# Patient Record
Sex: Female | Born: 1945 | Race: White | Hispanic: No | Marital: Married | Smoking: Former smoker
Health system: Southern US, Community
[De-identification: ages and names within clinical notes are randomized; demographics above are authoritative.]

## PROBLEM LIST (undated history)

## (undated) DIAGNOSIS — J45909 Unspecified asthma, uncomplicated: Secondary | ICD-10-CM

## (undated) DIAGNOSIS — E785 Hyperlipidemia, unspecified: Secondary | ICD-10-CM

## (undated) HISTORY — PX: OTHER SURGICAL HISTORY: SHX169

---

## 2005-07-15 ENCOUNTER — Encounter: Payer: Self-pay | Admitting: Obstetrics and Gynecology

## 2005-07-29 ENCOUNTER — Encounter: Payer: Self-pay | Admitting: Obstetrics and Gynecology

## 2005-08-29 ENCOUNTER — Encounter: Payer: Self-pay | Admitting: Obstetrics and Gynecology

## 2010-07-20 ENCOUNTER — Ambulatory Visit: Payer: Self-pay | Admitting: Unknown Physician Specialty

## 2016-07-15 ENCOUNTER — Emergency Department
Admission: EM | Admit: 2016-07-15 | Discharge: 2016-07-15 | Disposition: A | Discharge status: Home / Self Care | Payer: Worker's Compensation | Attending: Student in an Organized Health Care Education/Training Program | Admitting: Student in an Organized Health Care Education/Training Program

## 2016-07-15 ENCOUNTER — Encounter: Payer: Self-pay | Admitting: Emergency Medicine

## 2016-07-15 DIAGNOSIS — J45909 Unspecified asthma, uncomplicated: Secondary | ICD-10-CM | POA: Insufficient documentation

## 2016-07-15 DIAGNOSIS — M7989 Other specified soft tissue disorders: Secondary | ICD-10-CM | POA: Diagnosis present

## 2016-07-15 DIAGNOSIS — Z87891 Personal history of nicotine dependence: Secondary | ICD-10-CM | POA: Insufficient documentation

## 2016-07-15 DIAGNOSIS — L03011 Cellulitis of right finger: Secondary | ICD-10-CM | POA: Insufficient documentation

## 2016-07-15 HISTORY — DX: Unspecified asthma, uncomplicated: J45.909

## 2016-07-15 MED ORDER — SULFAMETHOXAZOLE-TRIMETHOPRIM 800-160 MG PO TABS: 1.0000 | ORAL_TABLET | Freq: Two times a day (BID) | ORAL | 0 refills | Status: AC

## 2016-07-15 MED ORDER — PENTAFLUOROPROP-TETRAFLUOROETH EX AERO
INHALATION_SPRAY | CUTANEOUS | Status: AC
  Filled 2016-07-15: qty 30

## 2016-07-15 NOTE — ED Triage Notes (Signed)
Pt is worker comp

## 2016-07-15 NOTE — ED Triage Notes (Signed)
Pt reports hurt 3rd digit right hand a little over week ago at work. Swelling noted. Not sure how she hurt. repetitive motion.

## 2016-07-15 NOTE — ED Notes (Signed)
Per EHS Manager ([employer]) Claudia PaulaJeff Bryant [phone] pt doesn't require any type of drug screening.

## 2016-07-15 NOTE — ED Notes (Signed)
Pt reports finger pain started about 3 weeks ago under the nail of her middle finger.  Pt reports worsening swelling and pain since then. 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  No drainage noted at this time, but there is a pus filled blister below the nail.

## 2016-07-15 NOTE — ED Provider Notes (Signed)
[facility] Emergency Department Provider Note ____________________________________________  Time seen: Approximately 3:21 PM  I have reviewed the triage vital signs and the nursing notes.   HISTORY  Chief Complaint Finger Injury  HPI Rosine BeatDoris J Vasil is a 70 y.o. female that presents with swelling of the third digit of her right hand for 3 weeks that worsened in the last week. Patient states that pus started forming in the last week. Finger is not painful to touch. Patient can move his finger normally. Patient has never had anything like this before. Patient has been taking Advil for pain.  Past Medical History:  Diagnosis Date  . Asthma    There are no active problems to display for this patient.  History reviewed. No pertinent surgical history.  Prior to Admission medications   Not on File    Allergies Patient has no known allergies.  History reviewed. No pertinent family history.  Social History Social History  Substance Use Topics  . Smoking status: Former Games developermoker  . Smokeless tobacco: Never Used  . Alcohol use No    Review of Systems Constitutional: No recent illness. Cardiovascular: Denies chest pain or palpitations. Respiratory: Denies shortness of breath. Musculoskeletal: Full ROM of hand and fingers.  Neurological: Negative for focal weakness or numbness. No tingling.  ____________________________________________  PHYSICAL EXAM:  VITAL SIGNS: ED Triage Vitals  Enc Vitals Group     BP 07/15/16 1505 (!) 173/77     Pulse Rate 07/15/16 1504 84     Resp 07/15/16 1504 18     Temp 07/15/16 1504 98.7 F (37.1 C)     Temp Source 07/15/16 1504 Oral     SpO2 07/15/16 1504 96 %     Weight 07/15/16 1502 160 lb (72.6 kg)     Height 07/15/16 1502 5\' 1"  (1.549 m)     Head Circumference --      Peak Flow --      Pain Score 07/15/16 1503 3     Pain Loc --      Pain Edu? --      Excl. in GC? --     Constitutional: Alert and oriented.  Well appearing and in no acute distress. Eyes: Conjunctivae are normal. EOMI. Head: Atraumatic. Neck: No stridor.  Respiratory: Normal respiratory effort.   Musculoskeletal: Swelling of 3rd digit of right hand. 1cm area against cuticle filled with pus.  Neurologic:  Normal speech and language. No gross focal neurologic deficits are appreciated. Speech is normal. No gait instability. Skin:  Skin is warm, dry and intact. Atraumatic. Psychiatric: Mood and affect are normal. Speech and behavior are normal.  ____________________________________________   LABS (all labs ordered are listed, but only abnormal results are displayed)  Labs Reviewed - No data to display ____________________________________________  RADIOLOGY  PROCEDURES  Procedure(s) performed:   Iodine was applied to area. Numbing spray was applied to finger. An 11 blade was used to make a 1 mm incision under cuticle. Pus was drained and Bandage was applied.   INITIAL IMPRESSION / ASSESSMENT AND PLAN / ED COURSE  Clinical Course     Culture was sent to lab since patient has never had a paronychia before. Patient was given a prescription for Bactrim.   Rosine BeatDoris J Turrell was advised to follow up with PCP.  Rosine BeatDoris J Vandenbos was also advised to return to the emergency department for symptoms that change or worsen if unable to schedule an appointment.  ____________________________________________   FINAL CLINICAL IMPRESSION(S) /  ED DIAGNOSES  Final diagnoses:  Paronychia of finger of right hand    Discharge Medication List as of 07/15/2016  4:05 PM    START taking these medications   Details  sulfamethoxazole-trimethoprim (BACTRIM DS,SEPTRA DS) 800-160 MG tablet Take 1 tablet by mouth 2 (two) times daily., Starting Fri 07/15/2016, Until Wed 07/20/2016, Print           Enid DerryAshley Dariana Garbett, PA-C 07/15/16 1821    Willy EddyPatrick Robinson, MD 07/15/16 1905

## 2016-07-18 LAB — AEROBIC CULTURE W GRAM STAIN (SUPERFICIAL SPECIMEN)

## 2016-07-18 LAB — AEROBIC CULTURE  (SUPERFICIAL SPECIMEN): GRAM STAIN: NONE SEEN

## 2019-11-03 ENCOUNTER — Other Ambulatory Visit: Payer: Self-pay

## 2019-11-03 ENCOUNTER — Emergency Department: Payer: PRIVATE HEALTH INSURANCE

## 2019-11-03 ENCOUNTER — Inpatient Hospital Stay
Admission: EM | Admit: 2019-11-03 | Discharge: 2019-11-05 | DRG: 056 | Disposition: A | Discharge status: Home / Self Care | Payer: PRIVATE HEALTH INSURANCE | Attending: Internal Medicine | Admitting: Internal Medicine

## 2019-11-03 ENCOUNTER — Encounter: Payer: Self-pay | Admitting: Emergency Medicine

## 2019-11-03 DIAGNOSIS — Z87891 Personal history of nicotine dependence: Secondary | ICD-10-CM | POA: Diagnosis not present

## 2019-11-03 DIAGNOSIS — E785 Hyperlipidemia, unspecified: Secondary | ICD-10-CM | POA: Diagnosis present

## 2019-11-03 DIAGNOSIS — I10 Essential (primary) hypertension: Secondary | ICD-10-CM | POA: Diagnosis present

## 2019-11-03 DIAGNOSIS — Z8249 Family history of ischemic heart disease and other diseases of the circulatory system: Secondary | ICD-10-CM | POA: Diagnosis not present

## 2019-11-03 DIAGNOSIS — J45909 Unspecified asthma, uncomplicated: Secondary | ICD-10-CM | POA: Diagnosis present

## 2019-11-03 DIAGNOSIS — E7800 Pure hypercholesterolemia, unspecified: Secondary | ICD-10-CM

## 2019-11-03 DIAGNOSIS — R29810 Facial weakness: Secondary | ICD-10-CM | POA: Diagnosis present

## 2019-11-03 DIAGNOSIS — I6389 Other cerebral infarction: Secondary | ICD-10-CM | POA: Diagnosis not present

## 2019-11-03 DIAGNOSIS — G8191 Hemiplegia, unspecified affecting right dominant side: Principal | ICD-10-CM | POA: Diagnosis present

## 2019-11-03 DIAGNOSIS — R4781 Slurred speech: Secondary | ICD-10-CM | POA: Diagnosis present

## 2019-11-03 DIAGNOSIS — I639 Cerebral infarction, unspecified: Secondary | ICD-10-CM | POA: Diagnosis present

## 2019-11-03 DIAGNOSIS — I160 Hypertensive urgency: Secondary | ICD-10-CM | POA: Diagnosis present

## 2019-11-03 DIAGNOSIS — R42 Dizziness and giddiness: Secondary | ICD-10-CM | POA: Diagnosis not present

## 2019-11-03 DIAGNOSIS — Z79899 Other long term (current) drug therapy: Secondary | ICD-10-CM | POA: Diagnosis not present

## 2019-11-03 DIAGNOSIS — E669 Obesity, unspecified: Secondary | ICD-10-CM | POA: Diagnosis present

## 2019-11-03 DIAGNOSIS — I6381 Other cerebral infarction due to occlusion or stenosis of small artery: Secondary | ICD-10-CM | POA: Diagnosis present

## 2019-11-03 DIAGNOSIS — Z20822 Contact with and (suspected) exposure to covid-19: Secondary | ICD-10-CM | POA: Diagnosis present

## 2019-11-03 DIAGNOSIS — Z6830 Body mass index (BMI) 30.0-30.9, adult: Secondary | ICD-10-CM

## 2019-11-03 HISTORY — DX: Hyperlipidemia, unspecified: E78.5

## 2019-11-03 LAB — COMPREHENSIVE METABOLIC PANEL
ALT: 19 U/L (ref 0–44)
AST: 18 U/L (ref 15–41)
Albumin: 4.8 g/dL (ref 3.5–5.0)
Alkaline Phosphatase: 81 U/L (ref 38–126)
Anion gap: 11 (ref 5–15)
BUN: 15 mg/dL (ref 8–23)
CO2: 22 mmol/L (ref 22–32)
Calcium: 9.3 mg/dL (ref 8.9–10.3)
Chloride: 106 mmol/L (ref 98–111)
Creatinine, Ser: 0.52 mg/dL (ref 0.44–1.00)
GFR calc Af Amer: >60 mL/min (ref >60)
GFR calc non Af Amer: >60 mL/min (ref >60)
Glucose, Bld: 114 mg/dL — ABNORMAL HIGH (ref 70–99)
Potassium: 3.6 mmol/L (ref 3.5–5.1)
Sodium: 139 mmol/L (ref 135–145)
Total Bilirubin: 1 mg/dL (ref 0.3–1.2)
Total Protein: 7.7 g/dL (ref 6.5–8.1)

## 2019-11-03 LAB — LIPID PANEL
Cholesterol: 357 mg/dL — ABNORMAL HIGH (ref 0–200)
HDL: 61 mg/dL (ref >40)
LDL Cholesterol: 276 mg/dL — ABNORMAL HIGH (ref 0–99)
Total CHOL/HDL Ratio: 5.9 RATIO
Triglycerides: 101 mg/dL (ref <150)
VLDL: 20 mg/dL (ref 0–40)

## 2019-11-03 LAB — DIFFERENTIAL
Abs Immature Granulocytes: 0.01 10*3/uL (ref 0.00–0.07)
Basophils Absolute: 0 10*3/uL (ref 0.0–0.1)
Basophils Relative: 0 %
Eosinophils Absolute: 0.1 10*3/uL (ref 0.0–0.5)
Eosinophils Relative: 1 %
Immature Granulocytes: 0 %
Lymphocytes Relative: 17 %
Lymphs Abs: 1.2 10*3/uL (ref 0.7–4.0)
Monocytes Absolute: 0.3 10*3/uL (ref 0.1–1.0)
Monocytes Relative: 5 %
Neutro Abs: 5.3 10*3/uL (ref 1.7–7.7)
Neutrophils Relative %: 77 %

## 2019-11-03 LAB — PROTIME-INR
INR: 0.9 (ref 0.8–1.2)
Prothrombin Time: 12.4 seconds (ref 11.4–15.2)

## 2019-11-03 LAB — SARS CORONAVIRUS 2 (TAT 6-24 HRS): SARS Coronavirus 2: NEGATIVE

## 2019-11-03 LAB — CBC
HCT: 40.4 % (ref 36.0–46.0)
Hemoglobin: 13.1 g/dL (ref 12.0–15.0)
MCH: 28.5 pg (ref 26.0–34.0)
MCHC: 32.4 g/dL (ref 30.0–36.0)
MCV: 88 fL (ref 80.0–100.0)
Platelets: 191 10*3/uL (ref 150–400)
RBC: 4.59 MIL/uL (ref 3.87–5.11)
RDW: 13.3 % (ref 11.5–15.5)
WBC: 6.9 10*3/uL (ref 4.0–10.5)
nRBC: 0 % (ref 0.0–0.2)

## 2019-11-03 LAB — HEMOGLOBIN A1C
Hgb A1c MFr Bld: 5.5 % (ref 4.8–5.6)
Mean Plasma Glucose: 111.15 mg/dL

## 2019-11-03 LAB — GLUCOSE, CAPILLARY: Glucose-Capillary: 110 mg/dL — ABNORMAL HIGH (ref 70–99)

## 2019-11-03 LAB — APTT: aPTT: <24 seconds — ABNORMAL LOW (ref 24–36)

## 2019-11-03 MED ORDER — SODIUM CHLORIDE 0.9 % IV SOLN: INTRAVENOUS | Status: DC

## 2019-11-03 MED ORDER — ACETAMINOPHEN 650 MG RE SUPP: 650.0000 mg | RECTAL | Status: DC | PRN

## 2019-11-03 MED ORDER — IOHEXOL 350 MG/ML SOLN
75.0000 mL | Freq: Once | INTRAVENOUS | Status: AC | PRN
  Administered 2019-11-03: 75 mL via INTRAVENOUS

## 2019-11-03 MED ORDER — ONDANSETRON HCL 4 MG/2ML IJ SOLN: 4.0000 mg | Freq: Four times a day (QID) | INTRAMUSCULAR | Status: DC | PRN

## 2019-11-03 MED ORDER — ACETAMINOPHEN 160 MG/5ML PO SOLN
650.0000 mg | ORAL | Status: DC | PRN
  Filled 2019-11-03: qty 20.3

## 2019-11-03 MED ORDER — ACETAMINOPHEN 325 MG PO TABS: 650.0000 mg | ORAL_TABLET | ORAL | Status: DC | PRN

## 2019-11-03 MED ORDER — ALBUTEROL SULFATE (2.5 MG/3ML) 0.083% IN NEBU: 3.0000 mL | INHALATION_SOLUTION | RESPIRATORY_TRACT | Status: DC | PRN

## 2019-11-03 MED ORDER — ENOXAPARIN SODIUM 40 MG/0.4ML ~~LOC~~ SOLN
40.0000 mg | SUBCUTANEOUS | Status: DC
  Administered 2019-11-03 – 2019-11-04 (×2): 40 mg via SUBCUTANEOUS
  Filled 2019-11-03 (×2): qty 0.4

## 2019-11-03 MED ORDER — ASPIRIN 300 MG RE SUPP
300.0000 mg | Freq: Every day | RECTAL | Status: DC
  Administered 2019-11-03: 300 mg via RECTAL
  Filled 2019-11-03 (×2): qty 1

## 2019-11-03 MED ORDER — STROKE: EARLY STAGES OF RECOVERY BOOK
Freq: Once | Status: AC
  Filled 2019-11-03: qty 1

## 2019-11-03 MED ORDER — ATORVASTATIN CALCIUM 20 MG PO TABS
80.0000 mg | ORAL_TABLET | Freq: Every day | ORAL | Status: DC
  Administered 2019-11-04: 80 mg via ORAL
  Filled 2019-11-03: qty 4

## 2019-11-03 NOTE — H&P (Addendum)
History and Physical:    Claudia Bryant   WER:[id number] DOB: Jan 11, 1946 DOA: 11/03/2019  Referring MD/provider: Kem Boroughs, FNP PCP: Patient, No Pcp Per   Patient coming from: Home  Chief Complaint: Dizziness, unsteady gait  History of Present Illness:   Claudia Bryant is an 74 y.o. female with medical history significant for asthma, hyperlipidemia (treated with supplements), who presented to the hospital with dizziness and unsteady gait.  She said her symptoms started last night around 7:30 PM of 8 PM.  She is not very sure about the timing.  She says she just kept falling into things.  She also said her speech was slurred.  She does not report any headache, weakness or numbness in the extremities, change in vision, vomiting.  She said she has not had any confusion.  No chest pain, shortness of breath, fever, chills.  ED Course:  The patient had severe hypertension in the emergency room with BP as high as 200/84.  CT head showed acute infarct in the left lentiform nucleus. She was out of the window for tPA. The hospitalist team was consulted to admit the patient for further evaluation.    ROS:   ROS All other systems reviewed were negative  Past Medical History:   Past Medical History:  Diagnosis Date  . Asthma   . Hyperlipidemia     Past Surgical History:   Past Surgical History:  Procedure Laterality Date  . none      Social History:   Social History   Socioeconomic History  . Marital status: Married    Spouse name: Not on file  . Number of children: Not on file  . Years of education: Not on file  . Highest education level: Not on file  Occupational History  . Not on file  Tobacco Use  . Smoking status: Former Games developer  . Smokeless tobacco: Never Used  Substance and Sexual Activity  . Alcohol use: No  . Drug use: No  . Sexual activity: Not on file  Other Topics Concern  . Not on file  Social History Narrative  . Not on file   Social Determinants  of Health   Financial Resource Strain:   . Difficulty of Paying Living Expenses: Not on file  Food Insecurity:   . Worried About Programme researcher, broadcasting/film/video in the Last Year: Not on file  . Ran Out of Food in the Last Year: Not on file  Transportation Needs:   . Lack of Transportation (Medical): Not on file  . Lack of Transportation (Non-Medical): Not on file  Physical Activity:   . Days of Exercise per Week: Not on file  . Minutes of Exercise per Session: Not on file  Stress:   . Feeling of Stress : Not on file  Social Connections:   . Frequency of Communication with Friends and Family: Not on file  . Frequency of Social Gatherings with Friends and Family: Not on file  . Attends Religious Services: Not on file  . Active Member of Clubs or Organizations: Not on file  . Attends Banker Meetings: Not on file  . Marital Status: Not on file  Intimate Partner Violence:   . Fear of Current or Ex-Partner: Not on file  . Emotionally Abused: Not on file  . Physically Abused: Not on file  . Sexually Abused: Not on file    Allergies   Patient has no known allergies.  Family history:   Family History  Problem  Relation Age of Onset  . Heart attack Father     Current Medications:   Prior to Admission medications   Medication Sig Start Date End Date Taking? Authorizing Provider  albuterol (VENTOLIN HFA) 108 (90 Base) MCG/ACT inhaler Inhale 1-2 puffs into the lungs every 4 (four) hours as needed for wheezing or shortness of breath. 07/31/19  Yes [provider]    Physical Exam:   Vitals:   11/03/19 1505 11/03/19 1614 11/03/19 1619 11/03/19 1806  BP: (!) 200/84  (!) 180/105 (!) 198/87  Pulse: 86  89 89  Resp: 16  16 16   Temp:   98.6 F (37 C) 98.1 F (36.7 C)  TempSrc:   Oral Oral  SpO2: 100%  100% 99%  Weight:  73 kg    Height:  5\' 1"  (1.549 m)       Physical Exam: Blood pressure (!) 198/87, pulse 89, temperature 98.1 F (36.7 C), temperature source  Oral, resp. rate 16, height 5\' 1"  (1.549 m), weight 73 kg, SpO2 99 %. Gen: No acute distress. Head: Normocephalic, atraumatic. Eyes: Pupils equal, round and reactive to light. Extraocular movements intact.  Sclerae nonicteric.  Mouth: Dry mucous membranes Neck: Supple, no thyromegaly, no lymphadenopathy, no jugular venous distention. Chest: Lungs are clear to auscultation with good air movement. No rales, rhonchi or wheezes.  CV: Heart sounds are regular with an S1, S2. No murmurs, rubs or gallops.  Abdomen: Soft, nontender, nondistended with normal active bowel sounds. No palpable masses. Extremities: Extremities are without clubbing, or cyanosis. No edema. Pedal pulses 2+.  Skin: Warm and dry. No rashes, lesions or wounds Neuro: Alert and oriented times 3, right facial droop otherwise nonfocal Psych: Insight is good and judgment is appropriate. Mood and affect normal.   Data Review:    Labs: Basic Metabolic Panel: Recent Labs  Lab 11/03/19 1136  NA 139  K 3.6  CL 106  CO2 22  GLUCOSE 114*  BUN 15  CREATININE 0.52  CALCIUM 9.3   Liver Function Tests: Recent Labs  Lab 11/03/19 1136  AST 18  ALT 19  ALKPHOS 81  BILITOT 1.0  PROT 7.7  ALBUMIN 4.8   No results for input(s): LIPASE, AMYLASE in the last 168 hours. No results for input(s): AMMONIA in the last 168 hours. CBC: Recent Labs  Lab 11/03/19 1136  WBC 6.9  NEUTROABS 5.3  HGB 13.1  HCT 40.4  MCV 88.0  PLT 191   Cardiac Enzymes: No results for input(s): CKTOTAL, CKMB, CKMBINDEX, TROPONINI in the last 168 hours.  BNP (last 3 results) No results for input(s): PROBNP in the last 8760 hours. CBG: Recent Labs  Lab 11/03/19 1143  GLUCAP 110*    Urinalysis No results found for: COLORURINE, APPEARANCEUR, LABSPEC, PHURINE, GLUCOSEU, HGBUR, BILIRUBINUR, KETONESUR, PROTEINUR, UROBILINOGEN, NITRITE, LEUKOCYTESUR    Radiographic Studies: CT Angio Head W or Wo Contrast  Result Date: 11/03/2019 CLINICAL  DATA:  Dizziness, loss of balance, right facial droop EXAM: CT ANGIOGRAPHY HEAD AND NECK TECHNIQUE: Multidetector CT imaging of the head and neck was performed using the standard protocol during bolus administration of intravenous contrast. Multiplanar CT image reconstructions and MIPs were obtained to evaluate the vascular anatomy. Carotid stenosis measurements (when applicable) are obtained utilizing NASCET criteria, using the distal internal carotid diameter as the denominator. CONTRAST:  66mL OMNIPAQUE IOHEXOL 350 MG/ML SOLN COMPARISON:  None. FINDINGS: CT HEAD FINDINGS Brain: There is an area of low attenuation within the left lentiform nucleus. This may extend  to the caudate. There is no acute intracranial hemorrhage or mass effect. Ventricles and sulci are normal in size and configuration. There is no extra-axial fluid collection. Vascular: No hyperdense vessel. Skull: Unremarkable. Sinuses: Aerated. Orbits: Unremarkable. Review of the MIP images confirms the above findings CTA NECK FINDINGS Aortic arch: Great vessel origins are patent. Plaque is present at the origins, greatest along the proximal left subclavian with mild stenosis. Right carotid system: Patent. There is calcified and noncalcified plaque at the bifurcation and ICA origin causing less than 50% stenosis. Mild tortuosity of the distal cervical ICA. Left carotid system: Patent. There is calcified and noncalcified plaque at the ICA origin causing less than 50% stenosis. Mild tortuosity of the distal cervical ICA. Vertebral arteries: Extracranial vertebral arteries are patent. Right vertebral artery is dominant. Skeleton: Multilevel degenerative changes of the cervical spine. Other neck: No mass or adenopathy. Small subcentimeter right thyroid nodule, which does not require further follow-up. Upper chest: No apical lung mass. Review of the MIP images confirms the above findings CTA HEAD FINDINGS Anterior circulation: Intracranial internal carotid  arteries are patent. Anterior and middle cerebral arteries are patent. Posterior circulation: Intracranial vertebral arteries, basilar artery, and posterior cerebral arteries are patent. There is a large right and diminutive left posterior communicating artery. Posterior cerebral arteries are patent. Venous sinuses: As permitted by contrast timing, patent. 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 No acute intracranial hemorrhage. No large vessel occlusion. Plaque at the right greater than left ICA origins with less than 50% stenosis. Electronically Signed   By: Macy Mis M.D.   On: 11/03/2019 13:41   CT Angio Neck W and/or Wo Contrast  Result Date: 11/03/2019 CLINICAL DATA:  Dizziness, loss of balance, right facial droop EXAM: CT ANGIOGRAPHY HEAD AND NECK TECHNIQUE: Multidetector CT imaging of the head and neck was performed using the standard protocol during bolus administration of intravenous contrast. Multiplanar CT image reconstructions and MIPs were obtained to evaluate the vascular anatomy. Carotid stenosis measurements (when applicable) are obtained utilizing NASCET criteria, using the distal internal carotid diameter as the denominator. CONTRAST:  85mL OMNIPAQUE IOHEXOL 350 MG/ML SOLN COMPARISON:  None. FINDINGS: CT HEAD FINDINGS Brain: There is an area of low attenuation within the left lentiform nucleus. This may extend to the caudate. There is no acute intracranial hemorrhage or mass effect. Ventricles and sulci are normal in size and configuration. There is no extra-axial fluid collection. Vascular: No hyperdense vessel. Skull: Unremarkable. Sinuses: Aerated. Orbits: Unremarkable. Review of the MIP images confirms the above findings CTA NECK FINDINGS Aortic arch: Great vessel origins are patent. Plaque is present at the origins, greatest along the proximal left subclavian with mild  stenosis. Right carotid system: Patent. There is calcified and noncalcified plaque at the bifurcation and ICA origin causing less than 50% stenosis. Mild tortuosity of the distal cervical ICA. Left carotid system: Patent. There is calcified and noncalcified plaque at the ICA origin causing less than 50% stenosis. Mild tortuosity of the distal cervical ICA. Vertebral arteries: Extracranial vertebral arteries are patent. Right vertebral artery is dominant. Skeleton: Multilevel degenerative changes of the cervical spine. Other neck: No mass or adenopathy. Small subcentimeter right thyroid nodule, which does not require further follow-up. Upper chest: No apical lung mass. Review of the MIP images confirms the above findings CTA HEAD FINDINGS Anterior circulation: Intracranial internal carotid arteries are patent. Anterior and middle cerebral arteries are patent.  Posterior circulation: Intracranial vertebral arteries, basilar artery, and posterior cerebral arteries are patent. There is a large right and diminutive left posterior communicating artery. Posterior cerebral arteries are patent. Venous sinuses: As permitted by contrast timing, patent. 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 No acute intracranial hemorrhage. No large vessel occlusion. Plaque at the right greater than left ICA origins with less than 50% stenosis. Electronically Signed   By: Guadlupe Spanish M.D.   On: 11/03/2019 13:41    EKG: Independently reviewed.  Normal sinus rhythm    Assessment/Plan:   Active Problems:   CVA (cerebral vascular accident) (HCC)   Hyperlipidemia  Acute ischemic stroke: Admit to telemetry.  Keep n.p.o. for now.  Treat with aspirin and Lipitor.  Obtain 2D echo for further evaluation. Check lipid panel and HbA1c.  Consult neurologist.  PT, OT and ST evaluation.  Fall precautions.  Hypertensive urgency:  This is likely due permissive hypertension from acute stroke.  No aggressive BP reduction for now.  Hyperlipidemia: Check lipid profile.  Body mass index is 30.41 kg/m.  (Obesity)  Other information:   DVT prophylaxis: Lovenox Code Status: Full code. Family Communication: Plan discussed with patient Disposition Plan: Possible discharge to home in 2 days Consults called: Neurologist Admission status: Inpatient  The medical decision making on this patient was of high complexity and the patient is at high risk for clinical deterioration, therefore this is a level 3 visit.    Time spent 70 minutes  Michai Dieppa [facility]   How to contact the [facility] Attending or Consulting provider 7A - 7P or covering provider during after hours 7P -7A, for this patient?   1. Check the care team in [facility] and look for a) attending/consulting [facility] provider listed and b) the [facility] team listed 2. Log into [email or web address] and use [place]'s universal password to access. If you do not have the password, please contact the hospital operator. 3. Locate the [facility] provider you are looking for under [facility] and page to a number that you can be directly reached. 4. If you still have difficulty reaching the provider, please page the [facility] (Director on Call) for the Hospitalists listed on amion for assistance.  11/03/2019, 7:05 PM

## 2019-11-03 NOTE — ED Triage Notes (Signed)
Pt presents to ED via POV with c/o dizziness and loss of balance. Pt states "I just started getting swimmy headed and started walking into stuff". Pt states symptoms started 11/02/19 at 1930. Pt with noted R side facial droop at this time, grip strengths equal bilaterally, pt also with noted difficulty speaking, states "It's cause I'm so tired".

## 2019-11-03 NOTE — ED Notes (Signed)
Pt wheeled to bathroom. Pt able to transfer from bed to chair with one assist, pt unsteady on feet, pt states she feels weak. Pt assisted in bathroom and back to bed.

## 2019-11-03 NOTE — ED Notes (Signed)
Pt to CT

## 2019-11-03 NOTE — ED Notes (Signed)
Pt states that she was slurring words last evening. Pt thinks she is talking normally now. This RN noticed possible slurring of occasional word.

## 2019-11-03 NOTE — ED Notes (Signed)
Pt taken to restroom then to floor by ED tech

## 2019-11-03 NOTE — ED Notes (Signed)
CT called to get pt for CT

## 2019-11-03 NOTE — ED Provider Notes (Signed)
[facility] Emergency Department Provider Note ____________________________________________   None    (approximate)  I have reviewed the triage vital signs and the nursing notes.   HISTORY  Chief Complaint Dizziness and Loss of Balance  HPI Claudia Bryant is a 74 y.o. female presents to the emergency department for treatment and evaluation of dizziness and loss of balance that started yesterday evening at about 7:30pm. She denies headache, vision changes, or other symptoms of concern. She states that she feels very tired.     Past Medical History:  Diagnosis Date  . Asthma   . Hyperlipidemia     Patient Active Problem List   Diagnosis Date Noted  . CVA (cerebral vascular accident) (HCC) 11/03/2019  . Hyperlipidemia 11/03/2019    Past Surgical History:  Procedure Laterality Date  . none      Prior to Admission medications   Medication Sig Start Date End Date Taking? Authorizing Provider  albuterol (VENTOLIN HFA) 108 (90 Base) MCG/ACT inhaler Inhale 1-2 puffs into the lungs every 4 (four) hours as needed for wheezing or shortness of breath. 07/31/19  Yes [provider]    Allergies Patient has no known allergies.  Family History  Problem Relation Age of Onset  . Heart attack Father     Social History Social History   Tobacco Use  . Smoking status: Former Games developer  . Smokeless tobacco: Never Used  Substance Use Topics  . Alcohol use: No  . Drug use: No    Review of Systems  Constitutional: No fever/chills Eyes: No visual changes. ENT: No sore throat. Cardiovascular: Denies chest pain. Respiratory: Denies shortness of breath. Gastrointestinal: No abdominal pain.  No nausea, no vomiting.  No diarrhea.  No constipation. Genitourinary: Negative for dysuria. Musculoskeletal: Negative for back pain. Skin: Negative for rash. Neurological: Positive for right side weakness and facial  droop. ____________________________________________   PHYSICAL EXAM:  VITAL SIGNS: ED Triage Vitals  Enc Vitals Group     BP 11/03/19 1121 (!) 182/92     Pulse Rate 11/03/19 1121 80     Resp 11/03/19 1121 16     Temp 11/03/19 1121 98.6 F (37 C)     Temp Source 11/03/19 1121 Oral     SpO2 11/03/19 1121 99 %     Weight 11/03/19 1131 159 lb (72.1 kg)     Height 11/03/19 1131 5\' 1"  (1.549 m)     Head Circumference --      Peak Flow --      Pain Score 11/03/19 1130 0     Pain Loc --      Pain Edu? --      Excl. in GC? --     Constitutional: Alert and oriented. Well appearing and in no acute distress. Eyes: Conjunctivae are normal. PERRL. EOMI. Head: Atraumatic. Nose: No congestion/rhinnorhea. Mouth/Throat: Mucous membranes are moist.  Oropharynx non-erythematous. Neck: No stridor.   Hematological/Lymphatic/Immunilogical: No cervical lymphadenopathy. Cardiovascular: Normal rate, regular rhythm. Grossly normal heart sounds.  Good peripheral circulation. Respiratory: Normal respiratory effort.  No retractions. Lungs CTAB. Gastrointestinal: Soft and nontender. No distention. No abdominal bruits. No CVA tenderness. Genitourinary:  Musculoskeletal: No lower extremity tenderness nor edema.  No joint effusions. Neurologic: Slightly slow and slurred speech. Right side facial droop. Grip strength weaker on right. Right lower extremity weaker on right.  Skin:  Skin is warm, dry and intact. No rash noted. Psychiatric: Mood and affect are normal.   ____________________________________________   LABS (all  labs ordered are listed, but only abnormal results are displayed)  Labs Reviewed  APTT - Abnormal; Notable for the following components:      Result Value   aPTT <24 (*)    All other components within normal limits  COMPREHENSIVE METABOLIC PANEL - Abnormal; Notable for the following components:   Glucose, Bld 114 (*)    All other components within normal limits  GLUCOSE,  CAPILLARY - Abnormal; Notable for the following components:   Glucose-Capillary 110 (*)    All other components within normal limits  LIPID PANEL - Abnormal; Notable for the following components:   Cholesterol 357 (*)    LDL Cholesterol 276 (*)    All other components within normal limits  SARS CORONAVIRUS 2 (TAT 6-24 HRS)  PROTIME-INR  CBC  DIFFERENTIAL  HEMOGLOBIN A1C  CBG MONITORING, ED   ____________________________________________  EKG  ED ECG REPORT I, Seth Friedlander, FNP-BC personally viewed and interpreted this ECG.   Date: 11/03/2019  EKG Time: 1134  Rate: 80  Rhythm: normal sinus rhythm  Axis: normal  Intervals:none  ST&T Change: no elevation  ____________________________________________  RADIOLOGY  ED MD interpretation:    Acute infarction involving the left Latinaform nucleus possibly extending to the caudate. No intracranial hemorrhage.  Official radiology report(s): CT Angio Head W or Wo Contrast  Result Date: 11/03/2019 CLINICAL DATA:  Dizziness, loss of balance, right facial droop EXAM: CT ANGIOGRAPHY HEAD AND NECK TECHNIQUE: Multidetector CT imaging of the head and neck was performed using the standard protocol during bolus administration of intravenous contrast. Multiplanar CT image reconstructions and MIPs were obtained to evaluate the vascular anatomy. Carotid stenosis measurements (when applicable) are obtained utilizing NASCET criteria, using the distal internal carotid diameter as the denominator. CONTRAST:  47mL OMNIPAQUE IOHEXOL 350 MG/ML SOLN COMPARISON:  None. FINDINGS: CT HEAD FINDINGS Brain: There is an area of low attenuation within the left lentiform nucleus. This may extend to the caudate. There is no acute intracranial hemorrhage or mass effect. Ventricles and sulci are normal in size and configuration. There is no extra-axial fluid collection. Vascular: No hyperdense vessel. Skull: Unremarkable. Sinuses: Aerated. Orbits: Unremarkable. Review of  the MIP images confirms the above findings CTA NECK FINDINGS Aortic arch: Great vessel origins are patent. Plaque is present at the origins, greatest along the proximal left subclavian with mild stenosis. Right carotid system: Patent. There is calcified and noncalcified plaque at the bifurcation and ICA origin causing less than 50% stenosis. Mild tortuosity of the distal cervical ICA. Left carotid system: Patent. There is calcified and noncalcified plaque at the ICA origin causing less than 50% stenosis. Mild tortuosity of the distal cervical ICA. Vertebral arteries: Extracranial vertebral arteries are patent. Right vertebral artery is dominant. Skeleton: Multilevel degenerative changes of the cervical spine. Other neck: No mass or adenopathy. Small subcentimeter right thyroid nodule, which does not require further follow-up. Upper chest: No apical lung mass. Review of the MIP images confirms the above findings CTA HEAD FINDINGS Anterior circulation: Intracranial internal carotid arteries are patent. Anterior and middle cerebral arteries are patent. Posterior circulation: Intracranial vertebral arteries, basilar artery, and posterior cerebral arteries are patent. There is a large right and diminutive left posterior communicating artery. Posterior cerebral arteries are patent. Venous sinuses: As permitted by contrast timing, patent. 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 No acute intracranial hemorrhage. No large vessel occlusion. Plaque at the right greater than  left ICA origins with less than 50% stenosis. Electronically Signed   By: Macy Mis M.D.   On: 11/03/2019 13:41   CT Angio Neck W and/or Wo Contrast  Result Date: 11/03/2019 CLINICAL DATA:  Dizziness, loss of balance, right facial droop EXAM: CT ANGIOGRAPHY HEAD AND NECK TECHNIQUE: Multidetector CT imaging of the head and neck was  performed using the standard protocol during bolus administration of intravenous contrast. Multiplanar CT image reconstructions and MIPs were obtained to evaluate the vascular anatomy. Carotid stenosis measurements (when applicable) are obtained utilizing NASCET criteria, using the distal internal carotid diameter as the denominator. CONTRAST:  72mL OMNIPAQUE IOHEXOL 350 MG/ML SOLN COMPARISON:  None. FINDINGS: CT HEAD FINDINGS Brain: There is an area of low attenuation within the left lentiform nucleus. This may extend to the caudate. There is no acute intracranial hemorrhage or mass effect. Ventricles and sulci are normal in size and configuration. There is no extra-axial fluid collection. Vascular: No hyperdense vessel. Skull: Unremarkable. Sinuses: Aerated. Orbits: Unremarkable. Review of the MIP images confirms the above findings CTA NECK FINDINGS Aortic arch: Great vessel origins are patent. Plaque is present at the origins, greatest along the proximal left subclavian with mild stenosis. Right carotid system: Patent. There is calcified and noncalcified plaque at the bifurcation and ICA origin causing less than 50% stenosis. Mild tortuosity of the distal cervical ICA. Left carotid system: Patent. There is calcified and noncalcified plaque at the ICA origin causing less than 50% stenosis. Mild tortuosity of the distal cervical ICA. Vertebral arteries: Extracranial vertebral arteries are patent. Right vertebral artery is dominant. Skeleton: Multilevel degenerative changes of the cervical spine. Other neck: No mass or adenopathy. Small subcentimeter right thyroid nodule, which does not require further follow-up. Upper chest: No apical lung mass. Review of the MIP images confirms the above findings CTA HEAD FINDINGS Anterior circulation: Intracranial internal carotid arteries are patent. Anterior and middle cerebral arteries are patent. Posterior circulation: Intracranial vertebral arteries, basilar artery, and  posterior cerebral arteries are patent. There is a large right and diminutive left posterior communicating artery. Posterior cerebral arteries are patent. Venous sinuses: As permitted by contrast timing, patent. 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 No acute intracranial hemorrhage. No large vessel occlusion. Plaque at the right greater than left ICA origins with less than 50% stenosis. Electronically Signed   By: Macy Mis M.D.   On: 11/03/2019 13:41    ____________________________________________   PROCEDURES  Procedure(s) performed (including Critical Care):  Procedures  ____________________________________________   INITIAL IMPRESSION / ASSESSMENT AND PLAN     74 year old female presenting to the emergency department for treatment and evaluation after experiencing some dizziness and off-balance sensation yesterday evening.  See HPI for further details.  Plan will be to complete CT scans and get basic lab work.  DIFFERENTIAL DIAGNOSIS  CVA, TIA, ICH  ED COURSE  CTA confirms presence of acute CVA.  Plan will be to complete her stroke swallow study, give aspirin, and admit patient for further evaluation.  She continues to be able to move her right upper and lower extremities but they do remain slightly weaker than the left.  She continues to deny headache, vision changes, or other concerns.  Consult for hospitalist has been entered.  COVID-19 screening pending, but no symptoms. .  Admitted through the hospitalist service.  ____________________________________________   FINAL CLINICAL IMPRESSION(S) / ED DIAGNOSES  Final diagnoses:  Acute  cerebrovascular accident (CVA) [facility])     ED Discharge Orders    None       Claudia Bryant was evaluated in Emergency Department on 11/03/2019 for the symptoms described in the history of present illness. She was  evaluated in the context of the global COVID-19 pandemic, which necessitated consideration that the patient might be at risk for infection with the SARS-CoV-2 virus that causes COVID-19. Institutional protocols and algorithms that pertain to the evaluation of patients at risk for COVID-19 are in a state of rapid change based on information released by regulatory bodies including the CDC and federal and state organizations. These policies and algorithms were followed during the patient's care in the ED.   Note:  This document was prepared using Dragon voice recognition software and may include unintentional dictation errors.   Chinita Pester, FNP 11/03/19 2051    Dionne Bucy, MD 11/05/19 [phone]

## 2019-11-04 ENCOUNTER — Inpatient Hospital Stay (HOSPITAL_COMMUNITY)
Admit: 2019-11-04 | Discharge: 2019-11-04 | Disposition: A | Discharge status: Home / Self Care | Payer: PRIVATE HEALTH INSURANCE | Attending: Internal Medicine | Admitting: Internal Medicine

## 2019-11-04 DIAGNOSIS — I639 Cerebral infarction, unspecified: Secondary | ICD-10-CM

## 2019-11-04 DIAGNOSIS — I6389 Other cerebral infarction: Secondary | ICD-10-CM

## 2019-11-04 MED ORDER — AMLODIPINE BESYLATE 5 MG PO TABS
5.0000 mg | ORAL_TABLET | Freq: Every day | ORAL | Status: DC
  Administered 2019-11-04 – 2019-11-05 (×2): 5 mg via ORAL
  Filled 2019-11-04 (×2): qty 1

## 2019-11-04 MED ORDER — ASPIRIN 325 MG PO TABS
325.0000 mg | ORAL_TABLET | Freq: Every day | ORAL | Status: DC
  Administered 2019-11-04 – 2019-11-05 (×2): 325 mg via ORAL
  Filled 2019-11-04 (×3): qty 1

## 2019-11-04 MED ORDER — HYDROCHLOROTHIAZIDE 12.5 MG PO CAPS
12.5000 mg | ORAL_CAPSULE | Freq: Every day | ORAL | Status: DC
  Administered 2019-11-04 – 2019-11-05 (×2): 12.5 mg via ORAL
  Filled 2019-11-04 (×2): qty 1

## 2019-11-04 NOTE — Evaluation (Signed)
Speech Language Pathology Evaluation Patient Details Name: Claudia Bryant MRN: [id number] DOB: 12-21-1945 Today's Date: 11/04/2019 Time: 6734-1937 SLP Time Calculation (min) (ACUTE ONLY): 40 min  Problem List:  Patient Active Problem List   Diagnosis Date Noted  . CVA (cerebral vascular accident) (HCC) 11/03/2019  . Hyperlipidemia 11/03/2019   Past Medical History:  Past Medical History:  Diagnosis Date  . Asthma   . Hyperlipidemia    Past Surgical History:  Past Surgical History:  Procedure Laterality Date  . none     HPI:  Pt is an 74 y.o. female with medical history significant for asthma, hyperlipidemia (treated with supplements), who presented to the hospital with dizziness and unsteady gait.  She said her symptoms started last night around 7:30 PM of 8 PM.  She is not very sure about the timing.  She says she just kept falling into things.  She also said her speech was slurred.  She does not report any headache, weakness or numbness in the extremities, change in vision, vomiting.  She said she has not had any confusion.  The patient had severe hypertension in the emergency room with BP as high as 200/84.  CT head showed acute infarct in the left lentiform nucleus w/ possible extension to the caudate.  No cxr.    Assessment / Plan / Recommendation Clinical Impression  Pt appears to present w/ mild Motor Speech deficits c/b min Dysfluency at conversation level - this was inconsistent and not apparent when pt slowed down and took her time during conversation. Pt spoke rapidly during conversation, and she herself stated she has been min "anxious" w/ min increased stress recently; also stated she felt she was working "too many hours". This Dysfluency impacts her verbal communication at the conversation level. When pt utilized strategies of slowing rate of speech and pausing to take a deep breath(to relax), pt's Dysfluency was not pronounced. Practiced w/ pt looking for places in  conversation to pause to relax and to replenish breath support to complete the sentence w/ good clarity. Discussed the importance of sitting fully upright to support diahpragmatic breathing and breath support for speech. Also, discussed the importance of relaxing during verbal engagement w/ others; lessening anxiety/stress including envrionmental distractions during conversation.  NO expressive or receptive aphasia noted, No cognitive deficits noted. OM exam was grossly wfl for lingual/labial strength/ROM; slight decreased R upper labial tone but w/ full effort of smile, symmetry achieved(pt stated she has been told her smile is asymmetrical "a long time ago" by someone). Discussed Fluency Strategies w/ pt; practice and gave handouts. Recommend pt f/u w/ Outpatient skilled ST servcies for an Evaluation to continue working on strategies and to provide further education/support to improve communication in ADLs; education on reducing concerns of the Dysfluency. CM/NSG/MD updated. Pt agreed.     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)    Follow Up Recommendations  Outpatient SLP(f/u for eval)    Frequency and Duration (n/a)  (n/a)      SLP Evaluation Cognition  Overall Cognitive Status: Within Functional Limits for tasks assessed Arousal/Alertness: Awake/alert Orientation Level: Oriented X4 Attention: Focused;Sustained Focused Attention: Appears intact Sustained Attention: Appears intact Memory: Appears intact Awareness: Appears intact Problem Solving: Appears intact Executive Function: Reasoning("I might just want to be seen (outpt) just to be sure") Reasoning: Appears intact Behaviors: (slight anxiousness per her report; rapid talking)  Safety/Judgment: Appears intact       Comprehension  Auditory Comprehension Overall Auditory  Comprehension: Appears within functional limits for tasks assessed Yes/No Questions: Within Functional Limits Commands: Within Functional Limits Conversation: Complex Visual Recognition/Discrimination Discrimination: Not tested Reading Comprehension Reading Status: Within funtional limits(full sentences)    Expression Expression Primary Mode of Expression: Verbal Verbal Expression Overall Verbal Expression: Appears within functional limits for tasks assessed Initiation: No impairment Automatic Speech: Name;Social Response;Day of week;Counting Level of Generative/Spontaneous Verbalization: Conversation Repetition: No impairment Naming: No impairment Pragmatics: No impairment Interfering Components: (slight, intermittent dysfluency) Effective Techniques: (slowing down) Non-Verbal Means of Communication: Not applicable Written Expression Dominant Hand: Right Written Expression: Not tested   Oral / Motor  Oral Motor/Sensory Function Overall Oral Motor/Sensory Function: Within functional limits(slight dec'd tone in R labial corner) Motor Speech Overall Motor Speech: Impaired(slight-min) Respiration: Within functional limits Phonation: Normal Resonance: Within functional limits Articulation: Within functional limitis Intelligibility: Intelligible(100%) Motor Planning: Impaired(dysfluency) Level of Impairment: Sentence Motor Speech Errors: Aware Effective Techniques: Slow rate;Increased vocal intensity;Over-articulate;Pause(take a breath to relax)   GO                      Orinda Kenner, MS, CCC-SLP Jacek Colson 11/04/2019, 3:56 PM

## 2019-11-04 NOTE — Progress Notes (Signed)
Patient is NPO per swallow test. Received verbal order from Webb Silversmith NP for IVF NS at 25ml/hr.

## 2019-11-04 NOTE — Evaluation (Signed)
Occupational Therapy Evaluation Patient Details Name: Claudia Bryant MRN: [id number] DOB: [birth year] Today's Date: 11/04/2019    History of Present Illness Pt is 74 y/o F with PMH ashtma and HLD who was admitted for complaints of dizziness and gait disturbances. 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   Clinical Impression   Pt was seen for OT evaluation this date. Prior to hospital admission, pt was Indep with all ADLs/IADLs including driving and working at [employer]. Pt lives with spouse and son in [facility] with basement with 12 STE back entrance. Currently pt demonstrates impairments as described below (See OT problem list) which functionally limit her ability to perform ADL/self-care tasks. Pt currently requires CGA for standing ADLs and ADL mobility d/t decreased balance.  Pt would benefit from skilled OT to address noted impairments and functional limitations (see below for any additional details) in order to maximize safety and independence while minimizing falls risk and caregiver burden. Upon hospital discharge, recommend HHOT to maximize pt safety and return to functional independence during meaningful occupations of daily life.     Follow Up Recommendations  Home health OT;Supervision - Intermittent    Equipment Recommendations  Tub/shower seat    Recommendations for Other Services       Precautions / Restrictions Precautions Precautions: Fall Restrictions Weight Bearing Restrictions: No      Mobility Bed Mobility Overal bed mobility: Modified Independent             General bed mobility comments: slightly impulsive/in a hurry during session.  Transfers Overall transfer level: Needs assistance Equipment used: None Transfers: Sit to/from Stand Sit to Stand: Min guard         General transfer comment: Cues for safety including line/lead awareness, some impulsivity with transfers.    Balance Overall balance assessment: History of Falls;Needs  assistance Sitting-balance support: Feet supported;No upper extremity supported Sitting balance-Leahy Scale: Good     Standing balance support: No upper extremity supported Standing balance-Leahy Scale: Fair Standing balance comment: L lateral lean                           ADL either performed or assessed with clinical judgement   ADL                                         General ADL Comments: Pt able to perform seated LB dressing with Supv, seated UB ADLs with Indep, standing with no AD with CGA, fxl mobility with CGA with some use of IV pole for light stabilization.     Vision Patient Visual Report: No change from baseline       Perception     Praxis      Pertinent Vitals/Pain Pain Assessment: No/denies pain     Hand Dominance Right   Extremity/Trunk Assessment Upper Extremity Assessment Upper Extremity Assessment: Overall WFL for tasks assessed   Lower Extremity Assessment Lower Extremity Assessment: Overall WFL for tasks assessed       Communication Communication Communication: No difficulties   Cognition Arousal/Alertness: Awake/alert Behavior During Therapy: WFL for tasks assessed/performed Overall Cognitive Status: Within Functional Limits for tasks assessed                                 General Comments: somewhat jittery/anxious  when OT first presents, but ultimately calms down. A&O and appropriate with all cues.   General Comments       Exercises Exercises: Other exercises Other Exercises Other Exercises: OT facilitates education re: role of OT in acute setting, pt verbalized understanding. Other Exercises: OT facilitates education re: safety awareness/fall prevention measures. Pt verbalized understanding.   Shoulder Instructions      Home Living Family/patient expects to be discharged to:: Private residence Living Arrangements: Spouse/significant other Available Help at Discharge: Family Type of  Home: House Home Access: Stairs to enter(up to back deck) Technical brewer of Steps: 12 Entrance Stairs-Rails: Can reach both [place]: Laundry or work area in [employer]: None      Lives With: Spouse    Prior Functioning/Environment Level of Independence: Independent        Comments: working at [employer] in [place]. Able to sit/stand at job. Reports independence prior to admission with 1 mechanical fall about 6 months ago.        OT Problem List: Impaired balance (sitting and/or standing);Decreased coordination;Decreased safety awareness;Decreased knowledge of use of DME or AE;Decreased knowledge of precautions      OT Treatment/Interventions: Self-care/ADL training;Therapeutic exercise;DME and/or AE instruction;Therapeutic activities;Patient/family education;Balance training    OT Goals(Current goals can be found in the care plan section) Acute Rehab OT Goals Patient Stated Goal: to go home OT Goal Formulation: With patient Time For Goal Achievement: 11/18/19 Potential to Achieve Goals: Good  OT Frequency: Min 1X/week   Barriers to D/C:            Co-evaluation              AM-PAC OT "6 Clicks" Daily Activity     Outcome Measure Help from another person eating meals?: None Help from another person taking care of personal grooming?: None Help from another person toileting, which includes using toliet, bedpan, or urinal?: A Little Help from another person bathing (including washing, rinsing, drying)?: A Little Help from another person to put on and taking off regular upper body clothing?: None Help from another person to put on and taking off regular lower body clothing?: A Little 6 Click Score: 21   End of Session Equipment Utilized During Treatment: Gait belt  Activity Tolerance: Patient tolerated treatment well Patient left: in chair;with call bell/phone within reach;with chair alarm set  OT Visit Diagnosis:  Unsteadiness on feet (R26.81)                Time: 4193-7902 OT Time Calculation (min): 40 min Charges:  OT General Charges $OT Visit: 1 Visit OT Evaluation $OT Eval Low Complexity: 1 Low OT Treatments $Self Care/Home Management : 8-22 mins $Therapeutic Activity: 8-22 mins  Gerrianne Scale, [place], OTR/L ascom [phone] 11/04/19, 4:33 PM

## 2019-11-04 NOTE — TOC Transition Note (Signed)
Transition of Care [facility]) - CM/SW Discharge Note   Patient Details  Name: MARLOWE CINQUEMANI MRN: [id number] Date of Birth: 05-29-1946  Transition of Care [facility]) CM/SW Contact:  Elease Hashimoto, LCSW Phone Number: 11/04/2019, 10:36 AM   Clinical Narrative:  Met with pt who reports she lives with her husband and adult son. Between both of them they can provide assist and transportation. She wants to go to OP rehab and her son can transport to appointment. She has no equipment needs. She feels she is recovering and getting back to her independent level. She uses her GYN as her PCP. Will get MD to sign OP order and fax to [facility]. Continue to work on discharge needs.      Barriers to Discharge: Continued Medical Work up   Patient Goals and CMS Choice Patient states their goals for this hospitalization and ongoing recovery are:: I hope to go home soon and get back to my independent level      Discharge Placement                       Discharge Plan and Services In-house Referral: Clinical Social Work   Post Acute Care Choice: NA                               Social Determinants of Health (SDOH) Interventions     Readmission Risk Interventions No flowsheet data found.

## 2019-11-04 NOTE — Progress Notes (Signed)
Progress Note    Claudia Bryant  MGQ:[id number] DOB: 11/13/1945  DOA: 11/03/2019 PCP: Patient, No Pcp Per      Brief Narrative:    Medical records reviewed and are as summarized below: Claudia Bryant is an 74 y.o. female with medical history significant for asthma, hyperlipidemia (treated with supplements), who presented to the hospital with dizziness and unsteady gait.  She said her symptoms started last night around 7:30 PM of 8 PM.  She is not very sure about the timing.  She says she just kept falling into things.  She also said her speech was slurred.       Assessment/Plan:   Principal Problem:   CVA (cerebral vascular accident) (HCC) Active Problems:   Hyperlipidemia   Acute ischemic stroke in the left lentiform nucleus: Continue aspirin and Lipitor.  Obtain 2D echo for further evaluation.  Hemoglobin A1c is normal.  Follow-up with neurologist for further recommendations.  PT and OT.  She was seen by speech therapist this morning and she passed her swallow assessment.  Fall precautions.  Hypertensive urgency-uncontrolled hypertension: Patient said her blood pressure at home has been elevated with systolic readings in the 150s to 180s.  Start hydrochlorothiazide and amlodipine.  Monitor BP closely.  Hyperlipidemia: Total cholesterol 357, LDL 276, HDL 61 and triglycerides 101.  Continue Lipitor.  The importance of positive lifestyle changes including healthy eating habits, weight loss and regular exercise were reiterated.   Body mass index is 30.41 kg/m.  (Obesity)   Family Communication/Anticipated D/C date and plan/Code Status   DVT prophylaxis: Lovenox Code Status: Full code Family Communication: Plan discussed with patient Disposition Plan: Patient is from home and plan to discharge patient home tomorrow if BP is stable      Subjective:   No complaints.  No dizziness, unilateral numbness or weakness in the extremities.  She thinks her speech is much  clearer today.  She said she walked to the bathroom last night and her balance was not as bad as it was prior to admission.  Objective:    Vitals:   11/04/19 0052 11/04/19 0229 11/04/19 0442 11/04/19 0733  BP: (!) 187/71 (!) 180/76 (!) 162/71 (!) 187/83  Pulse: 82 76 81 79  Resp: 16 16 16 16   Temp: 98.2 F (36.8 C)   98.4 F (36.9 C)  TempSrc: Oral   Oral  SpO2: 96% 97% 97% 97%  Weight:      Height:       No intake or output data in the 24 hours ending 11/04/19 1054 Filed Weights   11/03/19 1131 11/03/19 1614  Weight: 72.1 kg 73 kg    Exam:  GEN: NAD SKIN: No rash EYES: EOMI ENT: MMM CV: RRR PULM: CTA B ABD: soft, ND, NT, +BS CNS: AAO x 3, mild right facial droop otherwise non focal EXT: No edema or tenderness   Data Reviewed:   I have personally reviewed following labs and imaging studies:  Labs: Labs show the following:   Basic Metabolic Panel: Recent Labs  Lab 11/03/19 1136  NA 139  K 3.6  CL 106  CO2 22  GLUCOSE 114*  BUN 15  CREATININE 0.52  CALCIUM 9.3   GFR Estimated Creatinine Clearance: 59 mL/min (by C-G formula based on SCr of 0.52 mg/dL). Liver Function Tests: Recent Labs  Lab 11/03/19 1136  AST 18  ALT 19  ALKPHOS 81  BILITOT 1.0  PROT 7.7  ALBUMIN 4.8   No  results for input(s): LIPASE, AMYLASE in the last 168 hours. No results for input(s): AMMONIA in the last 168 hours. Coagulation profile Recent Labs  Lab 11/03/19 1136  INR 0.9    CBC: Recent Labs  Lab 11/03/19 1136  WBC 6.9  NEUTROABS 5.3  HGB 13.1  HCT 40.4  MCV 88.0  PLT 191   Cardiac Enzymes: No results for input(s): CKTOTAL, CKMB, CKMBINDEX, TROPONINI in the last 168 hours. BNP (last 3 results) No results for input(s): PROBNP in the last 8760 hours. CBG: Recent Labs  Lab 11/03/19 1143  GLUCAP 110*   D-Dimer: No results for input(s): DDIMER in the last 72 hours. Hgb A1c: Recent Labs    11/03/19 1502  HGBA1C 5.5   Lipid Profile: Recent Labs      11/03/19 1502  CHOL 357*  HDL 61  LDLCALC 276*  TRIG 101  CHOLHDL 5.9   Thyroid function studies: No results for input(s): TSH, T4TOTAL, T3FREE, THYROIDAB in the last 72 hours.  Invalid input(s): FREET3 Anemia work up: No results for input(s): VITAMINB12, FOLATE, FERRITIN, TIBC, IRON, RETICCTPCT in the last 72 hours. Sepsis Labs: Recent Labs  Lab 11/03/19 1136  WBC 6.9    Microbiology Recent Results (from the past 240 hour(s))  SARS CORONAVIRUS 2 (TAT 6-24 HRS) Nasopharyngeal Nasopharyngeal Swab     Status: None   Collection Time: 11/03/19  2:36 PM   Specimen: Nasopharyngeal Swab  Result Value Ref Range Status   SARS Coronavirus 2 NEGATIVE NEGATIVE Final    Comment: (NOTE) SARS-CoV-2 target nucleic acids are NOT DETECTED. The SARS-CoV-2 RNA is generally detectable in upper and lower respiratory specimens during the acute phase of infection. Negative results do not preclude SARS-CoV-2 infection, do not rule out co-infections with other pathogens, and should not be used as the sole basis for treatment or other patient management decisions. Negative results must be combined with clinical observations, patient history, and epidemiological information. The expected result is Negative. Fact Sheet for Patients: HairSlick.no Fact Sheet for Healthcare Providers: quierodirigir.com This test is not yet approved or cleared by the [place] FDA and  has been authorized for detection and/or diagnosis of SARS-CoV-2 by FDA under an Emergency Use Authorization (EUA). This EUA will remain  in effect (meaning this test can be used) for the duration of the COVID-19 declaration under Section 56 4(b)(1) of the Act, 21 U.S.C. section 360bbb-3(b)(1), unless the authorization is terminated or revoked sooner. Performed at [facility], [address], [place], [place] [address]     Procedures and diagnostic studies:  CT  Angio Head W or Wo Contrast  Result Date: 11/03/2019 CLINICAL DATA:  Dizziness, loss of balance, right facial droop EXAM: CT ANGIOGRAPHY HEAD AND NECK TECHNIQUE: Multidetector CT imaging of the head and neck was performed using the standard protocol during bolus administration of intravenous contrast. Multiplanar CT image reconstructions and MIPs were obtained to evaluate the vascular anatomy. Carotid stenosis measurements (when applicable) are obtained utilizing NASCET criteria, using the distal internal carotid diameter as the denominator. CONTRAST:  23mL OMNIPAQUE IOHEXOL 350 MG/ML SOLN COMPARISON:  None. FINDINGS: CT HEAD FINDINGS Brain: There is an area of low attenuation within the left lentiform nucleus. This may extend to the caudate. There is no acute intracranial hemorrhage or mass effect. Ventricles and sulci are normal in size and configuration. There is no extra-axial fluid collection. Vascular: No hyperdense vessel. Skull: Unremarkable. Sinuses: Aerated. Orbits: Unremarkable. Review of the MIP images confirms the above findings CTA  NECK FINDINGS Aortic arch: Great vessel origins are patent. Plaque is present at the origins, greatest along the proximal left subclavian with mild stenosis. Right carotid system: Patent. There is calcified and noncalcified plaque at the bifurcation and ICA origin causing less than 50% stenosis. Mild tortuosity of the distal cervical ICA. Left carotid system: Patent. There is calcified and noncalcified plaque at the ICA origin causing less than 50% stenosis. Mild tortuosity of the distal cervical ICA. Vertebral arteries: Extracranial vertebral arteries are patent. Right vertebral artery is dominant. Skeleton: Multilevel degenerative changes of the cervical spine. Other neck: No mass or adenopathy. Small subcentimeter right thyroid nodule, which does not require further follow-up. Upper chest: No apical lung mass. Review of the MIP images confirms the above findings CTA HEAD  FINDINGS Anterior circulation: Intracranial internal carotid arteries are patent. Anterior and middle cerebral arteries are patent. Posterior circulation: Intracranial vertebral arteries, basilar artery, and posterior cerebral arteries are patent. There is a large right and diminutive left posterior communicating artery. Posterior cerebral arteries are patent. Venous sinuses: As permitted by contrast timing, patent. 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 No acute intracranial hemorrhage. No large vessel occlusion. Plaque at the right greater than left ICA origins with less than 50% stenosis. Electronically Signed   By: Guadlupe Spanish M.D.   On: 11/03/2019 13:41   CT Angio Neck W and/or Wo Contrast  Result Date: 11/03/2019 CLINICAL DATA:  Dizziness, loss of balance, right facial droop EXAM: CT ANGIOGRAPHY HEAD AND NECK TECHNIQUE: Multidetector CT imaging of the head and neck was performed using the standard protocol during bolus administration of intravenous contrast. Multiplanar CT image reconstructions and MIPs were obtained to evaluate the vascular anatomy. Carotid stenosis measurements (when applicable) are obtained utilizing NASCET criteria, using the distal internal carotid diameter as the denominator. CONTRAST:  71mL OMNIPAQUE IOHEXOL 350 MG/ML SOLN COMPARISON:  None. FINDINGS: CT HEAD FINDINGS Brain: There is an area of low attenuation within the left lentiform nucleus. This may extend to the caudate. There is no acute intracranial hemorrhage or mass effect. Ventricles and sulci are normal in size and configuration. There is no extra-axial fluid collection. Vascular: No hyperdense vessel. Skull: Unremarkable. Sinuses: Aerated. Orbits: Unremarkable. Review of the MIP images confirms the above findings CTA NECK FINDINGS Aortic arch: Great vessel origins are patent. Plaque is present at the  origins, greatest along the proximal left subclavian with mild stenosis. Right carotid system: Patent. There is calcified and noncalcified plaque at the bifurcation and ICA origin causing less than 50% stenosis. Mild tortuosity of the distal cervical ICA. Left carotid system: Patent. There is calcified and noncalcified plaque at the ICA origin causing less than 50% stenosis. Mild tortuosity of the distal cervical ICA. Vertebral arteries: Extracranial vertebral arteries are patent. Right vertebral artery is dominant. Skeleton: Multilevel degenerative changes of the cervical spine. Other neck: No mass or adenopathy. Small subcentimeter right thyroid nodule, which does not require further follow-up. Upper chest: No apical lung mass. Review of the MIP images confirms the above findings CTA HEAD FINDINGS Anterior circulation: Intracranial internal carotid arteries are patent. Anterior and middle cerebral arteries are patent. Posterior circulation: Intracranial vertebral arteries, basilar artery, and posterior cerebral arteries are patent. There is a large right and diminutive left posterior communicating artery. Posterior cerebral arteries are patent. Venous sinuses: As permitted by contrast timing, patent. Review of the MIP images confirms the above findings IMPRESSION:  Acute infarction involving the left lentiform nucleus possibly extending to the caudate. No acute intracranial hemorrhage. No large vessel occlusion. Plaque at the right greater than left ICA origins with less than 50% stenosis. Electronically Signed   By: Macy Mis M.D.   On: 11/03/2019 13:41    Medications:   . amLODipine  5 mg Oral Daily  . aspirin  325 mg Oral Daily  . atorvastatin  80 mg Oral q1800  . enoxaparin (LOVENOX) injection  40 mg Subcutaneous Q24H  . hydrochlorothiazide  12.5 mg Oral Daily   Continuous Infusions: . sodium chloride 75 mL/hr at 11/03/19 2133     LOS: 1 day   Trip Cavanagh  [facility]      11/04/2019, 10:54 AM

## 2019-11-04 NOTE — Progress Notes (Signed)
*  PRELIMINARY RESULTS* Echocardiogram 2D Echocardiogram has been performed.  Claudia Bryant 11/04/2019, 1:49 PM

## 2019-11-04 NOTE — TOC Progression Note (Signed)
Transition of Care [facility]) - Progression Note    Patient Details  Name: Claudia Bryant MRN: [id number] Date of Birth: 05-31-1946  Transition of Care [facility]) CM/SW Contact  Jakub Debold, Lemar Livings, LCSW Phone Number: 11/04/2019, 2:07 PM  Clinical Narrative:  Have faxed OPSPT and PT referral for pt at [facility]. Will contact pt at home to set up appointments. Pt is aware and agreeable and will talk with son who will be transporting her to appointments.     Expected Discharge Plan: Home/Self Care Barriers to Discharge: Continued Medical Work up  Expected Discharge Plan and Services Expected Discharge Plan: Home/Self Care In-house Referral: Clinical Social Work   Post Acute Care Choice: NA Living arrangements for the past 2 months: Single Family Home                                       Social Determinants of Health (SDOH) Interventions    Readmission Risk Interventions No flowsheet data found.

## 2019-11-04 NOTE — Plan of Care (Signed)
  Problem: Education: Goal: Knowledge of General Education information will improve Description: Including pain rating scale, medication(s)/side effects and non-pharmacologic comfort measures Outcome: Progressing   Problem: Health Behavior/Discharge Planning: Goal: Ability to manage health-related needs will improve Outcome: Progressing   Problem: Clinical Measurements: Goal: Ability to maintain clinical measurements within normal limits will improve Outcome: Progressing Goal: Will remain free from infection Outcome: Progressing Goal: Diagnostic test results will improve Outcome: Progressing Goal: Respiratory complications will improve Outcome: Progressing Goal: Cardiovascular complication will be avoided Outcome: Progressing   Problem: Activity: Goal: Risk for activity intolerance will decrease Outcome: Progressing   Problem: Nutrition: Goal: Adequate nutrition will be maintained Outcome: Progressing   Problem: Coping: Goal: Level of anxiety will decrease Outcome: Progressing   Problem: Elimination: Goal: Will not experience complications related to bowel motility Outcome: Progressing Goal: Will not experience complications related to urinary retention Outcome: Progressing   Problem: Pain Managment: Goal: General experience of comfort will improve Outcome: Progressing   Problem: Skin Integrity: Goal: Risk for impaired skin integrity will decrease Outcome: Progressing   Problem: Education: Goal: Knowledge of disease or condition will improve Outcome: Progressing Goal: Knowledge of secondary prevention will improve Outcome: Progressing   Problem: Ischemic Stroke/TIA Tissue Perfusion: Goal: Complications of ischemic stroke/TIA will be minimized Outcome: Progressing

## 2019-11-04 NOTE — Evaluation (Signed)
Clinical/Bedside Swallow Evaluation Patient Details  Name: JESELLE HISER MRN: [id number] Date of Birth: October 24, 1945  Today's Date: 11/04/2019 Time: SLP Start Time (ACUTE ONLY): 0855 SLP Stop Time (ACUTE ONLY): 0935 SLP Time Calculation (min) (ACUTE ONLY): 40 min  Past Medical History:  Past Medical History:  Diagnosis Date  . Asthma   . Hyperlipidemia    Past Surgical History:  Past Surgical History:  Procedure Laterality Date  . none     HPI:  Pt is an 74 y.o. female with medical history significant for asthma, hyperlipidemia (treated with supplements), who presented to the hospital with dizziness and unsteady gait.  She said her symptoms started last night around 7:30 PM of 8 PM.  She is not very sure about the timing.  She says she just kept falling into things.  She also said her speech was slurred.  She does not report any headache, weakness or numbness in the extremities, change in vision, vomiting.  She said she has not had any confusion.  The patient had severe hypertension in the emergency room with BP as high as 200/84.  CT head showed acute infarct in the left lentiform nucleus w/ possible extension to the caudate.  No cxr.    Assessment / Plan / Recommendation Clinical Impression  Pt appears to present w/ adequate oropharyngeal phase swallowing function w/ no overt s/s of aspiration noted during po trials given; pt appears at reduced risk for aspiration when following general aspiration precautions. During the swallowing, pt exhibited adequate pharyngeal swallowing w/ timely swallowing post oral acceptance of bolus; laryngeal excursion appeared WFL. No overt coughing or wet vocal quality appreciated. No decline in respiratory effort noted. Oral phase was [facility] for bolus management, timely A-P transfer, and complete oral clearing post swallow. Pt fed self w/ setup given. OM exam grossly WFL w/ only a slight decreased tone in upper R labial corner of mouth -- w/ full, strong smile,  symmetry complete. No deficits noted during the oral phase. Recommend a regular diet consistency w/ general aspiration precautions - monitor any straw use; Pills in puree IF needed for easier swallowing at this time. No further skilled ST services indicated at this time. NSG to reconsult if any needs while admitted.  SLP Visit Diagnosis: Dysphagia, unspecified (R13.10)    Aspiration Risk  No limitations    Diet Recommendation  Regular diet w/ thin liquids. General aspiration precautions at meals, w/ oral intake.  Medication Administration: Whole meds with liquid(vs w/ a Puree if needed for easier swallowing)    Other  Recommendations Recommended Consults: (n/a) Oral Care Recommendations: Oral care BID;Patient independent with oral care Other Recommendations: (n/a)   Follow up Recommendations None      Frequency and Duration (n/a)  (n/a)       Prognosis Prognosis for Safe Diet Advancement: Good Barriers to Reach Goals: (n/a)      Swallow Study   General Date of Onset: 11/03/19 HPI: Pt is an 74 y.o. female with medical history significant for asthma, hyperlipidemia (treated with supplements), who presented to the hospital with dizziness and unsteady gait.  She said her symptoms started last night around 7:30 PM of 8 PM.  She is not very sure about the timing.  She says she just kept falling into things.  She also said her speech was slurred.  She does not report any headache, weakness or numbness in the extremities, change in vision, vomiting.  She said she has not had any confusion.  The patient  had severe hypertension in the emergency room with BP as high as 200/84.  CT head showed acute infarct in the left lentiform nucleus w/ possible extension to the caudate.  No cxr.  Type of Study: Bedside Swallow Evaluation Previous Swallow Assessment: none Diet Prior to this Study: NPO(difficulty w/ the [facility] screen in ED; Regular diet at home) Temperature Spikes Noted: No(wbc 6.9) Respiratory  Status: Room air History of Recent Intubation: No Behavior/Cognition: Alert;Cooperative;Pleasant mood;Distractible Oral Cavity Assessment: Within Functional Limits Oral Care Completed by SLP: Yes Oral Cavity - Dentition: Adequate natural dentition Vision: Functional for self-feeding Self-Feeding Abilities: Able to feed self Patient Positioning: Upright in bed(insight to sit fully upright) Baseline Vocal Quality: Normal Volitional Cough: Strong Volitional Swallow: Able to elicit    Oral/Motor/Sensory Function Overall Oral Motor/Sensory Function: Within functional limits(slight dec'd tone in R labial corner)   Ice Chips Ice chips: Within functional limits Presentation: Spoon(fed; 3 trials)   Thin Liquid Thin Liquid: Within functional limits Presentation: Cup;Self Fed(~4 ozs) Other Comments: monitor any straw use    Nectar Thick Nectar Thick Liquid: Not tested   Honey Thick Honey Thick Liquid: Not tested   Puree Puree: Within functional limits Presentation: Self Fed;Spoon(8 trials)   Solid     Solid: Within functional limits Presentation: Self Fed;Spoon(8 trials)       Orinda Kenner, MS, CCC-SLP Ashliegh Parekh 11/04/2019,3:33 PM

## 2019-11-04 NOTE — Plan of Care (Signed)
  Problem: Education: Goal: Knowledge of General Education information will improve Description: Including pain rating scale, medication(s)/side effects and non-pharmacologic comfort measures Outcome: Progressing   Problem: Health Behavior/Discharge Planning: Goal: Ability to manage health-related needs will improve Outcome: Progressing   Problem: Clinical Measurements: Goal: Ability to maintain clinical measurements within normal limits will improve Outcome: Progressing Goal: Will remain free from infection Outcome: Progressing Goal: Diagnostic test results will improve Outcome: Progressing Goal: Respiratory complications will improve Outcome: Progressing Goal: Cardiovascular complication will be avoided Outcome: Progressing   Problem: Activity: Goal: Risk for activity intolerance will decrease Outcome: Progressing   Problem: Nutrition: Goal: Adequate nutrition will be maintained Outcome: Progressing   Problem: Coping: Goal: Level of anxiety will decrease Outcome: Progressing   Problem: Elimination: Goal: Will not experience complications related to bowel motility Outcome: Progressing Goal: Will not experience complications related to urinary retention Outcome: Progressing   Problem: Pain Managment: Goal: General experience of comfort will improve Outcome: Progressing   Problem: Safety: Goal: Ability to remain free from injury will improve Outcome: Progressing   Problem: Skin Integrity: Goal: Risk for impaired skin integrity will decrease Outcome: Progressing   Problem: Education: Goal: Knowledge of disease or condition will improve Outcome: Progressing Goal: Knowledge of secondary prevention will improve Outcome: Progressing   Problem: Ischemic Stroke/TIA Tissue Perfusion: Goal: Complications of ischemic stroke/TIA will be minimized Outcome: Progressing   

## 2019-11-04 NOTE — Evaluation (Signed)
Physical Therapy Evaluation Patient Details Name: Claudia Bryant MRN: [id number] DOB: February 26, 1946 Today's Date: 11/04/2019   History of Present Illness  Pt admitted for complaints of dizziness and gait disturbances. 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 Other PMH includes asthma and HLD.  Clinical Impression  Pt is a pleasant 74 year old female who was admitted for CVA. Pt performs bed mobility with mod I, transfers with mod I, and ambulation with cga and no AD. Pt demonstrates deficits with safety awareness and balance. Slightly unsteady during OOB mobility and impulsive. Cues for safety given and awareness to deficits. No strength/sensation/coordination deficits noted in B UE/LE. Would benefit from skilled PT to address above deficits and promote optimal return to PLOF. Currently recommending OP PT for continued follow up to address functional needs.    Follow Up Recommendations Outpatient PT    Equipment Recommendations  None recommended by PT    Recommendations for Other Services       Precautions / Restrictions Precautions Precautions: Fall Restrictions Weight Bearing Restrictions: No      Mobility  Bed Mobility Overal bed mobility: Modified Independent             General bed mobility comments: slightly impulsive due to urge to get to bathroom. Once seated at EOB, slight R lateral leaning, able to self correct with cues.  Transfers Overall transfer level: Modified independent Equipment used: None             General transfer comment: transfers performed quickly with slight unsteadiness. Cues for safety including line/lead awareness  Ambulation/Gait Ambulation/Gait assistance: Min guard Gait Distance (Feet): 150 Feet Assistive device: None Gait Pattern/deviations: Step-through pattern     General Gait Details: ambulated in hallway with reciprocal gait pattern and unsteadiness noted. Slight drift to L side with cues for safety. Fast  pace.  Stairs            Wheelchair Mobility    Modified Rankin (Stroke Patients Only)       Balance Overall balance assessment: History of Falls;Needs assistance Sitting-balance support: Feet supported;No upper extremity supported Sitting balance-Leahy Scale: Good     Standing balance support: No upper extremity supported Standing balance-Leahy Scale: Fair Standing balance comment: L lateral lean                             Pertinent Vitals/Pain Pain Assessment: No/denies pain    Home Living Family/patient expects to be discharged to:: Private residence Living Arrangements: Spouse/significant other Available Help at Discharge: Family Type of Home: House Home Access: Stairs to enter Entrance Stairs-Rails: Can reach both Entrance Stairs-Number of Steps: 12(up back deck) Home Layout: Laundry or work area in [employer]: None      Prior Function Level of Independence: Independent         Comments: working at [employer] in [employer]. Able to sit/stand at job. Reports independence prior to admission with 1 mechanical fall about 6 months ago.     Hand Dominance        Extremity/Trunk Assessment   Upper Extremity Assessment Upper Extremity Assessment: Overall WFL for tasks assessed    Lower Extremity Assessment Lower Extremity Assessment: Overall WFL for tasks assessed       Communication   Communication: No difficulties  Cognition Arousal/Alertness: Awake/alert Behavior During Therapy: WFL for tasks assessed/performed Overall Cognitive Status: Within Functional Limits for tasks assessed  General Comments      Exercises Other Exercises Other Exercises: ambulated to bathroom. Impulsive and needs cues for sequencing. Tends to get tangled up in lines needing supervision for line management. Able to transfer on/off low toliet and perform hygiene with supervision    Assessment/Plan    PT Assessment Patient needs continued PT services  PT Problem List Decreased balance;Decreased mobility;Decreased safety awareness       PT Treatment Interventions DME instruction;Gait training;Stair training;Balance training    PT Goals (Current goals can be found in the Care Plan section)  Acute Rehab PT Goals Patient Stated Goal: to go home today PT Goal Formulation: With patient Time For Goal Achievement: 11/18/19 Potential to Achieve Goals: Good    Frequency 7X/week   Barriers to discharge        Co-evaluation               AM-PAC PT "6 Clicks" Mobility  Outcome Measure Help needed turning from your back to your side while in a flat bed without using bedrails?: None Help needed moving from lying on your back to sitting on the side of a flat bed without using bedrails?: None Help needed moving to and from a bed to a chair (including a wheelchair)?: A Little Help needed standing up from a chair using your arms (e.g., wheelchair or bedside chair)?: A Little Help needed to walk in hospital room?: A Little Help needed climbing 3-5 steps with a railing? : A Little 6 Click Score: 20    End of Session   Activity Tolerance: Patient tolerated treatment well Patient left: in bed;with bed alarm set Nurse Communication: Mobility status PT Visit Diagnosis: Unsteadiness on feet (R26.81)    Time: 5701-7793 PT Time Calculation (min) (ACUTE ONLY): 18 min   Charges:   PT Evaluation $PT Eval Low Complexity: 1 Low PT Treatments $Therapeutic Activity: 8-22 mins        Elizabeth Palau, PT, DPT [phone]   Alfredo Collymore 11/04/2019, 2:19 PM

## 2019-11-04 NOTE — Consult Note (Signed)
Reason for Consult: dizziness  Requesting Physician: Dr. Myriam Forehand   CC: unsteady   HPI: Claudia Bryant is an 74 y.o. female  with hx of asthma, hyperlipidemia only on herbal medications, who presented to the hospital with dizziness and unsteady gait along with generalized weakness that has resolved.  Pt admitted with SBP of 200. 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    Past Medical History:  Diagnosis Date  . Asthma   . Hyperlipidemia     Past Surgical History:  Procedure Laterality Date  . none      Family History  Problem Relation Age of Onset  . Heart attack Father     Social History:  reports that she has quit smoking. She has never used smokeless tobacco. She reports that she does not drink alcohol or use drugs.  No Known Allergies  Medications: I have reviewed the patient's current medications.  ROS: History obtained from the patient  General ROS: negative for - chills, fatigue, fever, night sweats, weight gain or weight loss Psychological ROS: negative for - behavioral disorder, hallucinations, memory difficulties, mood swings or suicidal ideation Ophthalmic ROS: negative for - blurry vision, double vision, eye pain or loss of vision ENT ROS: negative for - epistaxis, nasal discharge, oral lesions, sore throat, tinnitus or vertigo Allergy and Immunology ROS: negative for - hives or itchy/watery eyes Hematological and Lymphatic ROS: negative for - bleeding problems, bruising or swollen lymph nodes Endocrine ROS: negative for - galactorrhea, hair pattern changes, polydipsia/polyuria or temperature intolerance Respiratory ROS: negative for - cough, hemoptysis, shortness of breath or wheezing Cardiovascular ROS: negative for - chest pain, dyspnea on exertion, edema or irregular heartbeat Gastrointestinal ROS: negative for - abdominal pain, diarrhea, hematemesis, nausea/vomiting or stool incontinence Genito-Urinary ROS: negative for - dysuria, hematuria,  incontinence or urinary frequency/urgency Musculoskeletal ROS: negative for - joint swelling or muscular weakness Neurological ROS: as noted in HPI Dermatological ROS: negative for rash and skin lesion changes  Physical Examination: Blood pressure (!) 187/83, pulse 79, temperature 98.4 F (36.9 C), temperature source Oral, resp. rate 16, height 5\' 1"  (1.549 m), weight 73 kg, SpO2 97 %.   Neurological Examination   Mental Status: Alert, oriented, thought content appropriate.  Speech fluent without evidence of aphasia.  Able to follow 3 step commands without difficulty. Cranial Nerves: II: Discs flat bilaterally; Visual fields grossly normal, pupils equal, round, reactive to light and accommodation III,IV, VI: ptosis not present, extra-ocular motions intact bilaterally V,VII: smile symmetric, facial light touch sensation normal bilaterally VIII: hearing normal bilaterally IX,X: gag reflex present XI: bilateral shoulder shrug XII: midline tongue extension Motor: Right : Upper extremity   5/5    Left:     Upper extremity   5/5  Lower extremity   5/5     Lower extremity   5/5 Tone and bulk:normal tone throughout; no atrophy noted Sensory: Pinprick and light touch intact throughout, bilaterally Deep Tendon Reflexes: 1+ and symmetric throughout Plantars: Right: downgoing   Left: downgoing Cerebellar: normal finger-to-nose, normal rapid alternating movements and normal heel-to-shin test Gait: not tested    Laboratory Studies:   Basic Metabolic Panel: Recent Labs  Lab 11/03/19 1136  NA 139  K 3.6  CL 106  CO2 22  GLUCOSE 114*  BUN 15  CREATININE 0.52  CALCIUM 9.3    Liver Function Tests: Recent Labs  Lab 11/03/19 1136  AST 18  ALT 19  ALKPHOS 81  BILITOT 1.0  PROT 7.7  ALBUMIN 4.8   No results for input(s): LIPASE, AMYLASE in the last 168 hours. No results for input(s): AMMONIA in the last 168 hours.  CBC: Recent Labs  Lab 11/03/19 1136  WBC 6.9  NEUTROABS  5.3  HGB 13.1  HCT 40.4  MCV 88.0  PLT 191    Cardiac Enzymes: No results for input(s): CKTOTAL, CKMB, CKMBINDEX, TROPONINI in the last 168 hours.  BNP: Invalid input(s): POCBNP  CBG: Recent Labs  Lab 11/03/19 1143  GLUCAP 110*    Microbiology: Results for orders placed or performed during the hospital encounter of 11/03/19  SARS CORONAVIRUS 2 (TAT 6-24 HRS) Nasopharyngeal Nasopharyngeal Swab     Status: None   Collection Time: 11/03/19  2:36 PM   Specimen: Nasopharyngeal Swab  Result Value Ref Range Status   SARS Coronavirus 2 NEGATIVE NEGATIVE Final    Comment: (NOTE) SARS-CoV-2 target nucleic acids are NOT DETECTED. The SARS-CoV-2 RNA is generally detectable in upper and lower respiratory specimens during the acute phase of infection. Negative results do not preclude SARS-CoV-2 infection, do not rule out co-infections with other pathogens, and should not be used as the sole basis for treatment or other patient management decisions. Negative results must be combined with clinical observations, patient history, and epidemiological information. The expected result is Negative. Fact Sheet for Patients: SugarRoll.be Fact Sheet for Healthcare Providers: https://www.woods-mathews.com/ This test is not yet approved or cleared by the [place] FDA and  has been authorized for detection and/or diagnosis of SARS-CoV-2 by FDA under an Emergency Use Authorization (EUA). This EUA will remain  in effect (meaning this test can be used) for the duration of the COVID-19 declaration under Section 56 4(b)(1) of the Act, 21 U.S.C. section 360bbb-3(b)(1), unless the authorization is terminated or revoked sooner. Performed at [facility], [place] [address] [place], [place] [address]     Coagulation Studies: Recent Labs    11/03/19 1136  LABPROT 12.4  INR 0.9    Urinalysis: No results for input(s): COLORURINE, LABSPEC, PHURINE,  GLUCOSEU, HGBUR, BILIRUBINUR, KETONESUR, PROTEINUR, UROBILINOGEN, NITRITE, LEUKOCYTESUR in the last 168 hours.  Invalid input(s): APPERANCEUR  Lipid Panel:     Component Value Date/Time   CHOL 357 (H) 11/03/2019 1502   TRIG 101 11/03/2019 1502   HDL 61 11/03/2019 1502   CHOLHDL 5.9 11/03/2019 1502   VLDL 20 11/03/2019 1502   LDLCALC 276 (H) 11/03/2019 1502    HgbA1C:  Lab Results  Component Value Date   HGBA1C 5.5 11/03/2019    Urine Drug Screen:  No results found for: LABOPIA, COCAINSCRNUR, LABBENZ, AMPHETMU, THCU, LABBARB  Alcohol Level: No results for input(s): ETH in the last 168 hours.  Other results: EKG: normal EKG, normal sinus rhythm, unchanged from previous tracings.  Imaging: CT Angio Head W or Wo Contrast  Result Date: 11/03/2019 CLINICAL DATA:  Dizziness, loss of balance, right facial droop EXAM: CT ANGIOGRAPHY HEAD AND NECK TECHNIQUE: Multidetector CT imaging of the head and neck was performed using the standard protocol during bolus administration of intravenous contrast. Multiplanar CT image reconstructions and MIPs were obtained to evaluate the vascular anatomy. Carotid stenosis measurements (when applicable) are obtained utilizing NASCET criteria, using the distal internal carotid diameter as the denominator. CONTRAST:  19mL OMNIPAQUE IOHEXOL 350 MG/ML SOLN COMPARISON:  None. FINDINGS: CT HEAD FINDINGS Brain: There is an area of low attenuation within the left lentiform nucleus. This may extend to the caudate. There is no acute intracranial hemorrhage or mass effect. Ventricles  and sulci are normal in size and configuration. There is no extra-axial fluid collection. Vascular: No hyperdense vessel. Skull: Unremarkable. Sinuses: Aerated. Orbits: Unremarkable. Review of the MIP images confirms the above findings CTA NECK FINDINGS Aortic arch: Great vessel origins are patent. Plaque is present at the origins, greatest along the proximal left subclavian with mild stenosis.  Right carotid system: Patent. There is calcified and noncalcified plaque at the bifurcation and ICA origin causing less than 50% stenosis. Mild tortuosity of the distal cervical ICA. Left carotid system: Patent. There is calcified and noncalcified plaque at the ICA origin causing less than 50% stenosis. Mild tortuosity of the distal cervical ICA. Vertebral arteries: Extracranial vertebral arteries are patent. Right vertebral artery is dominant. Skeleton: Multilevel degenerative changes of the cervical spine. Other neck: No mass or adenopathy. Small subcentimeter right thyroid nodule, which does not require further follow-up. Upper chest: No apical lung mass. Review of the MIP images confirms the above findings CTA HEAD FINDINGS Anterior circulation: Intracranial internal carotid arteries are patent. Anterior and middle cerebral arteries are patent. Posterior circulation: Intracranial vertebral arteries, basilar artery, and posterior cerebral arteries are patent. There is a large right and diminutive left posterior communicating artery. Posterior cerebral arteries are patent. Venous sinuses: As permitted by contrast timing, patent. 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 No acute intracranial hemorrhage. No large vessel occlusion. Plaque at the right greater than left ICA origins with less than 50% stenosis. Electronically Signed   By: Guadlupe Spanish M.D.   On: 11/03/2019 13:41   CT Angio Neck W and/or Wo Contrast  Result Date: 11/03/2019 CLINICAL DATA:  Dizziness, loss of balance, right facial droop EXAM: CT ANGIOGRAPHY HEAD AND NECK TECHNIQUE: Multidetector CT imaging of the head and neck was performed using the standard protocol during bolus administration of intravenous contrast. Multiplanar CT image reconstructions and MIPs were obtained to evaluate the vascular anatomy. Carotid stenosis  measurements (when applicable) are obtained utilizing NASCET criteria, using the distal internal carotid diameter as the denominator. CONTRAST:  31mL OMNIPAQUE IOHEXOL 350 MG/ML SOLN COMPARISON:  None. FINDINGS: CT HEAD FINDINGS Brain: There is an area of low attenuation within the left lentiform nucleus. This may extend to the caudate. There is no acute intracranial hemorrhage or mass effect. Ventricles and sulci are normal in size and configuration. There is no extra-axial fluid collection. Vascular: No hyperdense vessel. Skull: Unremarkable. Sinuses: Aerated. Orbits: Unremarkable. Review of the MIP images confirms the above findings CTA NECK FINDINGS Aortic arch: Great vessel origins are patent. Plaque is present at the origins, greatest along the proximal left subclavian with mild stenosis. Right carotid system: Patent. There is calcified and noncalcified plaque at the bifurcation and ICA origin causing less than 50% stenosis. Mild tortuosity of the distal cervical ICA. Left carotid system: Patent. There is calcified and noncalcified plaque at the ICA origin causing less than 50% stenosis. Mild tortuosity of the distal cervical ICA. Vertebral arteries: Extracranial vertebral arteries are patent. Right vertebral artery is dominant. Skeleton: Multilevel degenerative changes of the cervical spine. Other neck: No mass or adenopathy. Small subcentimeter right thyroid nodule, which does not require further follow-up. Upper chest: No apical lung mass. Review of the MIP images confirms the above findings CTA HEAD FINDINGS Anterior circulation: Intracranial internal carotid arteries are patent. Anterior and middle cerebral arteries are patent. Posterior circulation: Intracranial vertebral arteries, basilar artery, and posterior cerebral arteries are patent.  There is a large right and diminutive left posterior communicating artery. Posterior cerebral arteries are patent. Venous sinuses: As permitted by contrast timing,  patent. 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 No acute intracranial hemorrhage. No large vessel occlusion. Plaque at the right greater than left ICA origins with less than 50% stenosis. Electronically Signed   By: Guadlupe Spanish M.D.   On: 11/03/2019 13:41     Assessment/Plan:  74 y.o. female  with hx of asthma, hyperlipidemia only on herbal medications, who presented to the hospital with dizziness and unsteady gait along with generalized weakness that has resolved.  Pt admitted with SBP of 200. 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 I think she can be on ASA 81mg  daily on d/c - anti hypertensive's being added - if blood pressure is better controlled and after 2d echo I suspect patient can be d/c today.   11/04/2019, 11:11 AM

## 2019-11-05 MED ORDER — ASPIRIN EC 81 MG PO TBEC: 81.0000 mg | DELAYED_RELEASE_TABLET | Freq: Every day | ORAL | Status: AC

## 2019-11-05 MED ORDER — ATORVASTATIN CALCIUM 80 MG PO TABS: 80.0000 mg | ORAL_TABLET | Freq: Every day | ORAL | 0 refills | Status: AC

## 2019-11-05 MED ORDER — AMLODIPINE BESYLATE 5 MG PO TABS: 5.0000 mg | ORAL_TABLET | Freq: Every day | ORAL | 0 refills | Status: AC

## 2019-11-05 NOTE — Consult Note (Signed)
Subjective: Back to baseline ambulating without assistance.   Past Medical History:  Diagnosis Date  . Asthma   . Hyperlipidemia     Past Surgical History:  Procedure Laterality Date  . none      Family History  Problem Relation Age of Onset  . Heart attack Father     Social History:  reports that she has quit smoking. She has never used smokeless tobacco. She reports that she does not drink alcohol or use drugs.  No Known Allergies  MPhysical Examination: Blood pressure (!) 153/81, pulse 74, temperature 98.2 F (36.8 C), temperature source Oral, resp. rate 18, height 5\' 1"  (1.549 m), weight 73 kg, SpO2 96 %.   Neurological Examination   Mental Status: Alert, oriented, thought content appropriate.  Speech fluent without evidence of aphasia.  Able to follow 3 step commands without difficulty. Cranial Nerves: II: Discs flat bilaterally; Visual fields grossly normal, pupils equal, round, reactive to light and accommodation III,IV, VI: ptosis not present, extra-ocular motions intact bilaterally V,VII: smile symmetric, facial light touch sensation normal bilaterally VIII: hearing normal bilaterally IX,X: gag reflex present XI: bilateral shoulder shrug XII: midline tongue extension Motor: Right : Upper extremity   5/5    Left:     Upper extremity   5/5  Lower extremity   5/5     Lower extremity   5/5 Tone and bulk:normal tone throughout; no atrophy noted Sensory: Pinprick and light touch intact throughout, bilaterally Deep Tendon Reflexes: 1+ and symmetric throughout Plantars: Right: downgoing   Left: downgoing Cerebellar: normal finger-to-nose, normal rapid alternating movements and normal heel-to-shin test Gait: not tested    Laboratory Studies:   Basic Metabolic Panel: Recent Labs  Lab 11/03/19 1136  NA 139  K 3.6  CL 106  CO2 22  GLUCOSE 114*  BUN 15  CREATININE 0.52  CALCIUM 9.3    Liver Function Tests: Recent Labs  Lab 11/03/19 1136  AST 18   ALT 19  ALKPHOS 81  BILITOT 1.0  PROT 7.7  ALBUMIN 4.8   No results for input(s): LIPASE, AMYLASE in the last 168 hours. No results for input(s): AMMONIA in the last 168 hours.  CBC: Recent Labs  Lab 11/03/19 1136  WBC 6.9  NEUTROABS 5.3  HGB 13.1  HCT 40.4  MCV 88.0  PLT 191    Cardiac Enzymes: No results for input(s): CKTOTAL, CKMB, CKMBINDEX, TROPONINI in the last 168 hours.  BNP: Invalid input(s): POCBNP  CBG: Recent Labs  Lab 11/03/19 1143  GLUCAP 110*    Microbiology: Results for orders placed or performed during the hospital encounter of 11/03/19  SARS CORONAVIRUS 2 (TAT 6-24 HRS) Nasopharyngeal Nasopharyngeal Swab     Status: None   Collection Time: 11/03/19  2:36 PM   Specimen: Nasopharyngeal Swab  Result Value Ref Range Status   SARS Coronavirus 2 NEGATIVE NEGATIVE Final    Comment: (NOTE) SARS-CoV-2 target nucleic acids are NOT DETECTED. The SARS-CoV-2 RNA is generally detectable in upper and lower respiratory specimens during the acute phase of infection. Negative results do not preclude SARS-CoV-2 infection, do not rule out co-infections with other pathogens, and should not be used as the sole basis for treatment or other patient management decisions. Negative results must be combined with clinical observations, patient history, and epidemiological information. The expected result is Negative. Fact Sheet for Patients: SugarRoll.be Fact Sheet for Healthcare Providers: https://www.woods-mathews.com/ This test is not yet approved or cleared by the [place] FDA and  has been  authorized for detection and/or diagnosis of SARS-CoV-2 by FDA under an Emergency Use Authorization (EUA). This EUA will remain  in effect (meaning this test can be used) for the duration of the COVID-19 declaration under Section 56 4(b)(1) of the Act, 21 U.S.C. section 360bbb-3(b)(1), unless the authorization is terminated  or revoked sooner. Performed at [facility], [address] [place], [place] [address]     Coagulation Studies: Recent Labs    11/03/19 1136  LABPROT 12.4  INR 0.9    Urinalysis: No results for input(s): COLORURINE, LABSPEC, PHURINE, GLUCOSEU, HGBUR, BILIRUBINUR, KETONESUR, PROTEINUR, UROBILINOGEN, NITRITE, LEUKOCYTESUR in the last 168 hours.  Invalid input(s): APPERANCEUR  Lipid Panel:     Component Value Date/Time   CHOL 357 (H) 11/03/2019 1502   TRIG 101 11/03/2019 1502   HDL 61 11/03/2019 1502   CHOLHDL 5.9 11/03/2019 1502   VLDL 20 11/03/2019 1502   LDLCALC 276 (H) 11/03/2019 1502    HgbA1C:  Lab Results  Component Value Date   HGBA1C 5.5 11/03/2019    Urine Drug Screen:  No results found for: LABOPIA, COCAINSCRNUR, LABBENZ, AMPHETMU, THCU, LABBARB  Alcohol Level: No results for input(s): ETH in the last 168 hours.  Other results: EKG: normal EKG, normal sinus rhythm, unchanged from previous tracings.  Imaging: CT Angio Head W or Wo Contrast  Result Date: 11/03/2019 CLINICAL DATA:  Dizziness, loss of balance, right facial droop EXAM: CT ANGIOGRAPHY HEAD AND NECK TECHNIQUE: Multidetector CT imaging of the head and neck was performed using the standard protocol during bolus administration of intravenous contrast. Multiplanar CT image reconstructions and MIPs were obtained to evaluate the vascular anatomy. Carotid stenosis measurements (when applicable) are obtained utilizing NASCET criteria, using the distal internal carotid diameter as the denominator. CONTRAST:  52mL OMNIPAQUE IOHEXOL 350 MG/ML SOLN COMPARISON:  None. FINDINGS: CT HEAD FINDINGS Brain: There is an area of low attenuation within the left lentiform nucleus. This may extend to the caudate. There is no acute intracranial hemorrhage or mass effect. Ventricles and sulci are normal in size and configuration. There is no extra-axial fluid collection. Vascular: No hyperdense vessel. Skull: Unremarkable.  Sinuses: Aerated. Orbits: Unremarkable. Review of the MIP images confirms the above findings CTA NECK FINDINGS Aortic arch: Great vessel origins are patent. Plaque is present at the origins, greatest along the proximal left subclavian with mild stenosis. Right carotid system: Patent. There is calcified and noncalcified plaque at the bifurcation and ICA origin causing less than 50% stenosis. Mild tortuosity of the distal cervical ICA. Left carotid system: Patent. There is calcified and noncalcified plaque at the ICA origin causing less than 50% stenosis. Mild tortuosity of the distal cervical ICA. Vertebral arteries: Extracranial vertebral arteries are patent. Right vertebral artery is dominant. Skeleton: Multilevel degenerative changes of the cervical spine. Other neck: No mass or adenopathy. Small subcentimeter right thyroid nodule, which does not require further follow-up. Upper chest: No apical lung mass. Review of the MIP images confirms the above findings CTA HEAD FINDINGS Anterior circulation: Intracranial internal carotid arteries are patent. Anterior and middle cerebral arteries are patent. Posterior circulation: Intracranial vertebral arteries, basilar artery, and posterior cerebral arteries are patent. There is a large right and diminutive left posterior communicating artery. Posterior cerebral arteries are patent. Venous sinuses: As permitted by contrast timing, patent. 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 No acute intracranial hemorrhage. No large vessel occlusion. Plaque at  the right greater than left ICA origins with less than 50% stenosis. Electronically Signed   By: Guadlupe SpanishPraneil  Patel M.D.   On: 11/03/2019 13:41   CT Angio Neck W and/or Wo Contrast  Result Date: 11/03/2019 CLINICAL DATA:  Dizziness, loss of balance, right facial droop EXAM: CT ANGIOGRAPHY HEAD AND NECK TECHNIQUE:  Multidetector CT imaging of the head and neck was performed using the standard protocol during bolus administration of intravenous contrast. Multiplanar CT image reconstructions and MIPs were obtained to evaluate the vascular anatomy. Carotid stenosis measurements (when applicable) are obtained utilizing NASCET criteria, using the distal internal carotid diameter as the denominator. CONTRAST:  75mL OMNIPAQUE IOHEXOL 350 MG/ML SOLN COMPARISON:  None. FINDINGS: CT HEAD FINDINGS Brain: There is an area of low attenuation within the left lentiform nucleus. This may extend to the caudate. There is no acute intracranial hemorrhage or mass effect. Ventricles and sulci are normal in size and configuration. There is no extra-axial fluid collection. Vascular: No hyperdense vessel. Skull: Unremarkable. Sinuses: Aerated. Orbits: Unremarkable. Review of the MIP images confirms the above findings CTA NECK FINDINGS Aortic arch: Great vessel origins are patent. Plaque is present at the origins, greatest along the proximal left subclavian with mild stenosis. Right carotid system: Patent. There is calcified and noncalcified plaque at the bifurcation and ICA origin causing less than 50% stenosis. Mild tortuosity of the distal cervical ICA. Left carotid system: Patent. There is calcified and noncalcified plaque at the ICA origin causing less than 50% stenosis. Mild tortuosity of the distal cervical ICA. Vertebral arteries: Extracranial vertebral arteries are patent. Right vertebral artery is dominant. Skeleton: Multilevel degenerative changes of the cervical spine. Other neck: No mass or adenopathy. Small subcentimeter right thyroid nodule, which does not require further follow-up. Upper chest: No apical lung mass. Review of the MIP images confirms the above findings CTA HEAD FINDINGS Anterior circulation: Intracranial internal carotid arteries are patent. Anterior and middle cerebral arteries are patent. Posterior circulation:  Intracranial vertebral arteries, basilar artery, and posterior cerebral arteries are patent. There is a large right and diminutive left posterior communicating artery. Posterior cerebral arteries are patent. Venous sinuses: As permitted by contrast timing, patent. 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 No acute intracranial hemorrhage. No large vessel occlusion. Plaque at the right greater than left ICA origins with less than 50% stenosis. Electronically Signed   By: Guadlupe SpanishPraneil  Patel M.D.   On: 11/03/2019 13:41   ECHOCARDIOGRAM COMPLETE BUBBLE STUDY  Result Date: 11/04/2019    ECHOCARDIOGRAM REPORT   Patient Name:   Dan HumphreysDORIS J Bryant Date of Exam: 11/04/2019 Medical Rec #:  [id number]      Height:       61.0 in Accession #:    [id number]     Weight:       160.9 lb Date of Birth:  11-20-1945     BSA:          1.722 m Patient Age:    73 years       BP:           183/81 mmHg Patient Gender: F              HR:           85 bpm. Exam Location:  [facility] Procedure: 2D Echo, Cardiac Doppler, Color Doppler and Saline Contrast Bubble            Study  Indications:     Stroke 434.91  History:         Patient has no prior history of Echocardiogram examinations.                  Risk Factors:Dyslipidemia.  Sonographer:     Cristela Blue RDCS (AE) Referring Phys:  [id number] Lurene Shadow Diagnosing Phys: Lorine Bears MD IMPRESSIONS  1. Left ventricular ejection fraction, by estimation, is 60 to 65%. The left ventricle has normal function. The left ventricle has no regional wall motion abnormalities. There is mild left ventricular hypertrophy. Left ventricular diastolic parameters are consistent with Grade I diastolic dysfunction (impaired relaxation).  2. Right ventricular systolic function is normal. The right ventricular size is normal. Tricuspid regurgitation signal is inadequate for assessing PA pressure.  3. Left atrial  size was mildly dilated.  4. The mitral valve is normal in structure. No evidence of mitral valve regurgitation. No evidence of mitral stenosis.  5. The aortic valve is normal in structure. Aortic valve regurgitation is not visualized. No aortic stenosis is present.  6. The inferior vena cava is normal in size with greater than 50% respiratory variability, suggesting right atrial pressure of 3 mmHg.  7. Agitated saline contrast bubble study was negative, with no evidence of any interatrial shunt. FINDINGS  Left Ventricle: Left ventricular ejection fraction, by estimation, is 60 to 65%. The left ventricle has normal function. The left ventricle has no regional wall motion abnormalities. The left ventricular internal cavity size was normal in size. There is  mild left ventricular hypertrophy. Left ventricular diastolic parameters are consistent with Grade I diastolic dysfunction (impaired relaxation). Right Ventricle: The right ventricular size is normal. No increase in right ventricular wall thickness. Right ventricular systolic function is normal. Tricuspid regurgitation signal is inadequate for assessing PA pressure. The tricuspid regurgitant velocity is 1.64 m/s, and with an assumed right atrial pressure of 10 mmHg, the estimated right ventricular systolic pressure is 20.8 mmHg. Left Atrium: Left atrial size was mildly dilated. Right Atrium: Right atrial size was normal in size. Pericardium: There is no evidence of pericardial effusion. Mitral Valve: The mitral valve is normal in structure. Normal mobility of the mitral valve leaflets. No evidence of mitral valve regurgitation. No evidence of mitral valve stenosis. Tricuspid Valve: The tricuspid valve is normal in structure. Tricuspid valve regurgitation is not demonstrated. No evidence of tricuspid stenosis. Aortic Valve: The aortic valve is normal in structure. Aortic valve regurgitation is not visualized. No aortic stenosis is present. Aortic valve mean  gradient measures 3.3 mmHg. Aortic valve peak gradient measures 6.9 mmHg. Aortic valve area, by VTI measures 2.40 cm. Pulmonic Valve: The pulmonic valve was normal in structure. Pulmonic valve regurgitation is not visualized. No evidence of pulmonic stenosis. Aorta: The aortic root is normal in size and structure. Venous: The inferior vena cava is normal in size with greater than 50% respiratory variability, suggesting right atrial pressure of 3 mmHg. IAS/Shunts: No atrial level shunt detected by color flow Doppler. Agitated saline contrast was given intravenously to evaluate for intracardiac shunting. Agitated saline contrast bubble study was negative, with no evidence of any interatrial shunt.  LEFT VENTRICLE PLAX 2D LVIDd:         3.98 cm  Diastology LVIDs:         2.46 cm  LV e' lateral:   5.77 cm/s LV PW:         0.90 cm  LV E/e' lateral: 11.4 LV IVS:  1.12 cm  LV e' medial:    6.96 cm/s LVOT diam:     2.00 cm  LV E/e' medial:  9.5 LV SV:         58 LV SV Index:   34 LVOT Area:     3.14 cm  RIGHT VENTRICLE RV Basal diam:  3.81 cm LEFT ATRIUM             Index       RIGHT ATRIUM           Index LA diam:        4.10 cm 2.38 cm/m  RA Area:     15.20 cm LA Vol (A2C):   29.5 ml 17.13 ml/m RA Volume:   42.30 ml  24.56 ml/m LA Vol (A4C):   35.8 ml 20.79 ml/m LA Biplane Vol: 34.7 ml 20.15 ml/m  AORTIC VALVE                   PULMONIC VALVE AV Area (Vmax):    1.83 cm    PV Vmax:        0.25 m/s AV Area (Vmean):   2.11 cm    PV Peak grad:   0.3 mmHg AV Area (VTI):     2.40 cm    RVOT Peak grad: 1 mmHg AV Vmax:           131.67 cm/s AV Vmean:          83.833 cm/s AV VTI:            0.242 m AV Peak Grad:      6.9 mmHg AV Mean Grad:      3.3 mmHg LVOT Vmax:         76.90 cm/s LVOT Vmean:        56.300 cm/s LVOT VTI:          0.185 m LVOT/AV VTI ratio: 0.76  AORTA Ao Root diam: 2.40 cm MITRAL VALVE                TRICUSPID VALVE MV Area (PHT): 3.20 cm     TR Peak grad:   10.8 mmHg MV Decel Time: 237 msec      TR Vmax:        164.00 cm/s MV E velocity: 66.00 cm/s MV A velocity: 104.00 cm/s  SHUNTS MV E/A ratio:  0.63         Systemic VTI:  0.18 m                             Systemic Diam: 2.00 cm Lorine Bears MD Electronically signed by Lorine Bears MD Signature Date/Time: 11/04/2019/3:45:32 PM    Final      Assessment/Plan:  74 y.o. female  with hx of asthma, hyperlipidemia only on herbal medications, who presented to the hospital with dizziness and unsteady gait along with generalized weakness that has resolved.  Pt admitted with SBP of 200. 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 I think she can be on ASA 81mg  daily on d/c - anti hypertensive's being added - d/c today and Neurology follow up as out patient.   11/05/2019, 12:01 PM

## 2019-11-05 NOTE — Discharge Summary (Addendum)
Physician Discharge Summary  Claudia Bryant [facility]:[id number] DOB: [birth year] DOA: 11/03/2019  PCP: Patient, No Pcp Per  Admit date: 11/03/2019 Discharge date: 11/05/2019  Discharge disposition: Home   Recommendations for Outpatient Follow-Up:   Outpatient follow-up with PCP in 1 to 2 weeks  Outpatient follow-up with neurologist in 1 month   Discharge Diagnosis:   Principal Problem:   CVA (cerebral vascular accident) [facility]) Active Problems:   Hyperlipidemia    Discharge Condition: Stable.  Diet recommendation: Low-salt, low-fat diet  Code status: Full code.    Hospital Course:   Claudia Bryant an 74 y.o.femalewith medical history significant for asthma, hypertension, hyperlipidemia (treated with supplements), who presented to the hospital with dizziness and unsteady gait. She said her symptoms started night prior to admission. She said she just kept falling into things. She also said her speech was slurred.  Work-up revealed acute ischemic stroke in the left lentiform nucleus.  Unfortunately, patient was not a candidate for TPA.  2D echo showed EF estimated at 72 to 65%, mild LVH and grade 1 diastolic dysfunction.  There was no evidence of large vessel occlusion on CTA head and neck.  She was seen by her neurologist who recommended low-dose aspirin at discharge.  Patient also had uncontrolled hypertension and severe hypercholesterolemia and she has been started on amlodipine and Lipitor respectively.  She was counseled on the importance of positive lifestyle changes including healthy eating habits, regular exercise and weight loss.  All her symptoms are resolved and she is deemed stable for discharge to home today.  PT recommended outpatient PT and OT recommended home health OT.     Discharge Exam:   Vitals:   11/05/19 0409 11/05/19 0731  BP: 138/72 (!) 153/81  Pulse: 77 74  Resp:  18  Temp: 97.6 F (36.4 C) 98.2 F (36.8 C)  SpO2: 97% 96%   Vitals:   11/04/19  2052 11/05/19 0001 11/05/19 0409 11/05/19 0731  BP: (!) 157/82 (!) 155/79 138/72 (!) 153/81  Pulse: 72 77 77 74  Resp: 16   18  Temp: 98.3 F (36.8 C) 98.3 F (36.8 C) 97.6 F (36.4 C) 98.2 F (36.8 C)  TempSrc: Oral Oral Oral Oral  SpO2: 98% 97% 97% 96%  Weight:      Height:         GEN: NAD SKIN: No rash EYES: EOMI ENT: MMM CV: RRR PULM: CTA B ABD: soft, ND, NT, +BS CNS: AAO x 3, non focal EXT: No edema or tenderness   The results of significant diagnostics from this hospitalization (including imaging, microbiology, ancillary and laboratory) are listed below for reference.     Procedures and Diagnostic Studies:   ECHOCARDIOGRAM COMPLETE BUBBLE STUDY  Result Date: 11/04/2019    ECHOCARDIOGRAM REPORT   Patient Name:   Claudia Bryant Moder Date of Exam: 11/04/2019 Medical Rec #:  [id number]      Height:       61.0 in Accession #:    [id number]     Weight:       160.9 lb Date of Birth:  [birth year]     BSA:          1.722 m Patient Age:    74 years       BP:           183/81 mmHg Patient Gender: F              HR:  85 bpm. Exam Location:  [facility] Procedure: 2D Echo, Cardiac Doppler, Color Doppler and Saline Contrast Bubble            Study Indications:     Stroke 434.91  History:         Patient has no prior history of Echocardiogram examinations.                  Risk Factors:Dyslipidemia.  Sonographer:     Cristela Blue RDCS (AE) Referring Phys:  [id number] Lurene Shadow Diagnosing Phys: Lorine Bears MD IMPRESSIONS  1. Left ventricular ejection fraction, by estimation, is 60 to 65%. The left ventricle has normal function. The left ventricle has no regional wall motion abnormalities. There is mild left ventricular hypertrophy. Left ventricular diastolic parameters are consistent with Grade I diastolic dysfunction (impaired relaxation).  2. Right ventricular systolic function is normal. The right ventricular size is normal. Tricuspid regurgitation signal is inadequate for assessing PA  pressure.  3. Left atrial size was mildly dilated.  4. The mitral valve is normal in structure. No evidence of mitral valve regurgitation. No evidence of mitral stenosis.  5. The aortic valve is normal in structure. Aortic valve regurgitation is not visualized. No aortic stenosis is present.  6. The inferior vena cava is normal in size with greater than 50% respiratory variability, suggesting right atrial pressure of 3 mmHg.  7. Agitated saline contrast bubble study was negative, with no evidence of any interatrial shunt. FINDINGS  Left Ventricle: Left ventricular ejection fraction, by estimation, is 60 to 65%. The left ventricle has normal function. The left ventricle has no regional wall motion abnormalities. The left ventricular internal cavity size was normal in size. There is  mild left ventricular hypertrophy. Left ventricular diastolic parameters are consistent with Grade I diastolic dysfunction (impaired relaxation). Right Ventricle: The right ventricular size is normal. No increase in right ventricular wall thickness. Right ventricular systolic function is normal. Tricuspid regurgitation signal is inadequate for assessing PA pressure. The tricuspid regurgitant velocity is 1.64 m/s, and with an assumed right atrial pressure of 10 mmHg, the estimated right ventricular systolic pressure is 20.8 mmHg. Left Atrium: Left atrial size was mildly dilated. Right Atrium: Right atrial size was normal in size. Pericardium: There is no evidence of pericardial effusion. Mitral Valve: The mitral valve is normal in structure. Normal mobility of the mitral valve leaflets. No evidence of mitral valve regurgitation. No evidence of mitral valve stenosis. Tricuspid Valve: The tricuspid valve is normal in structure. Tricuspid valve regurgitation is not demonstrated. No evidence of tricuspid stenosis. Aortic Valve: The aortic valve is normal in structure. Aortic valve regurgitation is not visualized. No aortic stenosis is present.  Aortic valve mean gradient measures 3.3 mmHg. Aortic valve peak gradient measures 6.9 mmHg. Aortic valve area, by VTI measures 2.40 cm. Pulmonic Valve: The pulmonic valve was normal in structure. Pulmonic valve regurgitation is not visualized. No evidence of pulmonic stenosis. Aorta: The aortic root is normal in size and structure. Venous: The inferior vena cava is normal in size with greater than 50% respiratory variability, suggesting right atrial pressure of 3 mmHg. IAS/Shunts: No atrial level shunt detected by color flow Doppler. Agitated saline contrast was given intravenously to evaluate for intracardiac shunting. Agitated saline contrast bubble study was negative, with no evidence of any interatrial shunt.  LEFT VENTRICLE PLAX 2D LVIDd:         3.98 cm  Diastology LVIDs:         2.46 cm  LV e' lateral:   5.77 cm/s LV PW:         0.90 cm  LV E/e' lateral: 11.4 LV IVS:        1.12 cm  LV e' medial:    6.96 cm/s LVOT diam:     2.00 cm  LV E/e' medial:  9.5 LV SV:         58 LV SV Index:   34 LVOT Area:     3.14 cm  RIGHT VENTRICLE RV Basal diam:  3.81 cm LEFT ATRIUM             Index       RIGHT ATRIUM           Index LA diam:        4.10 cm 2.38 cm/m  RA Area:     15.20 cm LA Vol (A2C):   29.5 ml 17.13 ml/m RA Volume:   42.30 ml  24.56 ml/m LA Vol (A4C):   35.8 ml 20.79 ml/m LA Biplane Vol: 34.7 ml 20.15 ml/m  AORTIC VALVE                   PULMONIC VALVE AV Area (Vmax):    1.83 cm    PV Vmax:        0.25 m/s AV Area (Vmean):   2.11 cm    PV Peak grad:   0.3 mmHg AV Area (VTI):     2.40 cm    RVOT Peak grad: 1 mmHg AV Vmax:           131.67 cm/s AV Vmean:          83.833 cm/s AV VTI:            0.242 m AV Peak Grad:      6.9 mmHg AV Mean Grad:      3.3 mmHg LVOT Vmax:         76.90 cm/s LVOT Vmean:        56.300 cm/s LVOT VTI:          0.185 m LVOT/AV VTI ratio: 0.76  AORTA Ao Root diam: 2.40 cm MITRAL VALVE                TRICUSPID VALVE MV Area (PHT): 3.20 cm     TR Peak grad:   10.8 mmHg MV Decel  Time: 237 msec     TR Vmax:        164.00 cm/s MV E velocity: 66.00 cm/s MV A velocity: 104.00 cm/s  SHUNTS MV E/A ratio:  0.63         Systemic VTI:  0.18 m                             Systemic Diam: 2.00 cm Lorine Bears MD Electronically signed by Lorine Bears MD Signature Date/Time: 11/04/2019/3:45:32 PM    Final      Labs:   Basic Metabolic Panel: Recent Labs  Lab 11/03/19 1136  NA 139  K 3.6  CL 106  CO2 22  GLUCOSE 114*  BUN 15  CREATININE 0.52  CALCIUM 9.3   GFR Estimated Creatinine Clearance: 59 mL/min (by C-G formula based on SCr of 0.52 mg/dL). Liver Function Tests: Recent Labs  Lab 11/03/19 1136  AST 18  ALT 19  ALKPHOS 81  BILITOT 1.0  PROT 7.7  ALBUMIN 4.8   No results for input(s): LIPASE, AMYLASE in the  last 168 hours. No results for input(s): AMMONIA in the last 168 hours. Coagulation profile Recent Labs  Lab 11/03/19 1136  INR 0.9    CBC: Recent Labs  Lab 11/03/19 1136  WBC 6.9  NEUTROABS 5.3  HGB 13.1  HCT 40.4  MCV 88.0  PLT 191   Cardiac Enzymes: No results for input(s): CKTOTAL, CKMB, CKMBINDEX, TROPONINI in the last 168 hours. BNP: Invalid input(s): POCBNP CBG: Recent Labs  Lab 11/03/19 1143  GLUCAP 110*   D-Dimer No results for input(s): DDIMER in the last 72 hours. Hgb A1c Recent Labs    11/03/19 1502  HGBA1C 5.5   Lipid Profile Recent Labs    11/03/19 1502  CHOL 357*  HDL 61  LDLCALC 276*  TRIG 101  CHOLHDL 5.9   Thyroid function studies No results for input(s): TSH, T4TOTAL, T3FREE, THYROIDAB in the last 72 hours.  Invalid input(s): FREET3 Anemia work up No results for input(s): VITAMINB12, FOLATE, FERRITIN, TIBC, IRON, RETICCTPCT in the last 72 hours. Microbiology Recent Results (from the past 240 hour(s))  SARS CORONAVIRUS 2 (TAT 6-24 HRS) Nasopharyngeal Nasopharyngeal Swab     Status: None   Collection Time: 11/03/19  2:36 PM   Specimen: Nasopharyngeal Swab  Result Value Ref Range Status   SARS  Coronavirus 2 NEGATIVE NEGATIVE Final    Comment: (NOTE) SARS-CoV-2 target nucleic acids are NOT DETECTED. The SARS-CoV-2 RNA is generally detectable in upper and lower respiratory specimens during the acute phase of infection. Negative results do not preclude SARS-CoV-2 infection, do not rule out co-infections with other pathogens, and should not be used as the sole basis for treatment or other patient management decisions. Negative results must be combined with clinical observations, patient history, and epidemiological information. The expected result is Negative. Fact Sheet for Patients: SugarRoll.be Fact Sheet for Healthcare Providers: https://www.woods-mathews.com/ This test is not yet approved or cleared by the [place] FDA and  has been authorized for detection and/or diagnosis of SARS-CoV-2 by FDA under an Emergency Use Authorization (EUA). This EUA will remain  in effect (meaning this test can be used) for the duration of the COVID-19 declaration under Section 56 4(b)(1) of the Act, 21 U.S.C. section 360bbb-3(b)(1), unless the authorization is terminated or revoked sooner. Performed at [facility], [place] [address], [place], [place] [address]      Discharge Instructions:   Discharge Instructions    Diet - low sodium heart healthy   Complete by: As directed    Discharge instructions   Complete by: As directed    Low fat and low salt diet recommended   Increase activity slowly   Complete by: As directed      Allergies as of 11/05/2019   No Known Allergies     Medication List    TAKE these medications   albuterol 108 (90 Base) MCG/ACT inhaler Commonly known as: VENTOLIN HFA Inhale 1-2 puffs into the lungs every 4 (four) hours as needed for wheezing or shortness of breath.   amLODipine 5 MG tablet Commonly known as: NORVASC Take 1 tablet (5 mg total) by mouth daily.   aspirin EC 81 MG tablet Take 1 tablet  (81 mg total) by mouth daily.   atorvastatin 80 MG tablet Commonly known as: LIPITOR Take 1 tablet (80 mg total) by mouth daily at 6 PM.      Follow-up Information    [place]. Schedule an appointment as soon as possible for a visit in 1  month(s).   Contact information: [address] [place] [id number] [phone]           Time coordinating discharge: 25 minutes  Signed:  Lurene ShadowBERNARD Doxie Augenstein  [facility] 11/05/2019, 8:48 AM

## 2020-10-02 IMAGING — CT CT ANGIO NECK
2 of 11 series · 7 of 33 positions shown · IV contrast (omnipaque)
Comparison: None.

CLINICAL DATA: Dizziness, loss of balance, right facial droop

EXAM:
CT ANGIOGRAPHY HEAD AND NECK
TECHNIQUE: Multidetector CT imaging of the head and neck was performed using
the standard protocol during bolus administration of intravenous
contrast. Multiplanar CT image reconstructions and MIPs were
obtained to evaluate the vascular anatomy. Carotid stenosis
measurements (when applicable) are obtained utilizing NASCET
criteria, using the distal internal carotid diameter as the
denominator.
CONTRAST:  75mL OMNIPAQUE IOHEXOL 350 MG/ML SOLN

[Series 518: cta head neck thins · axial · 0.42mm/px · z∈[-279,-64]mm · 5 of 645 slices shown]
[im 108/645  soft-tissue]
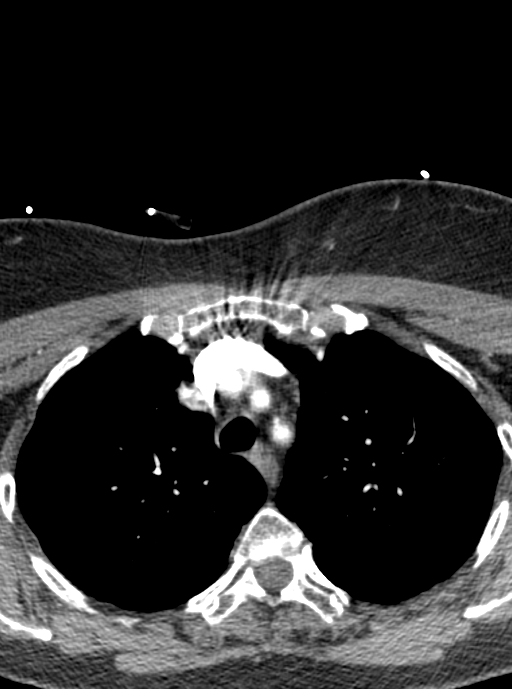
[im 215/645  bone]
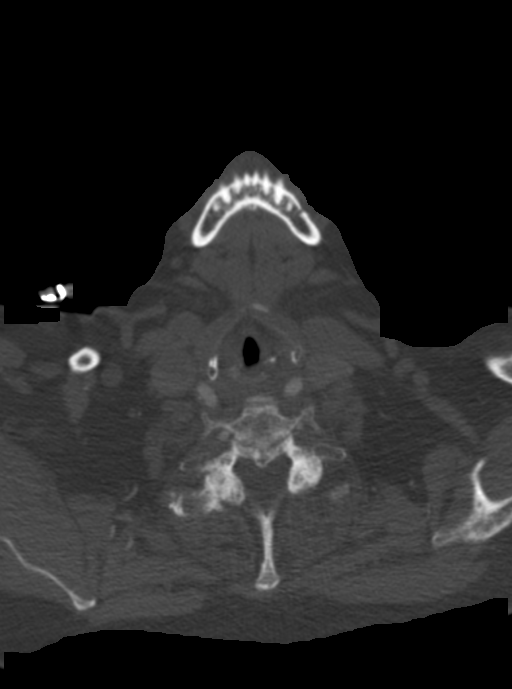
[im 323/645  soft-tissue]
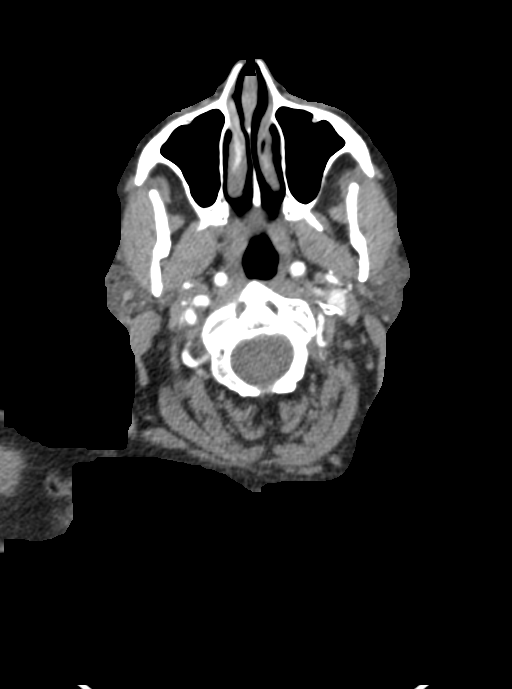
[im 430/645  bone]
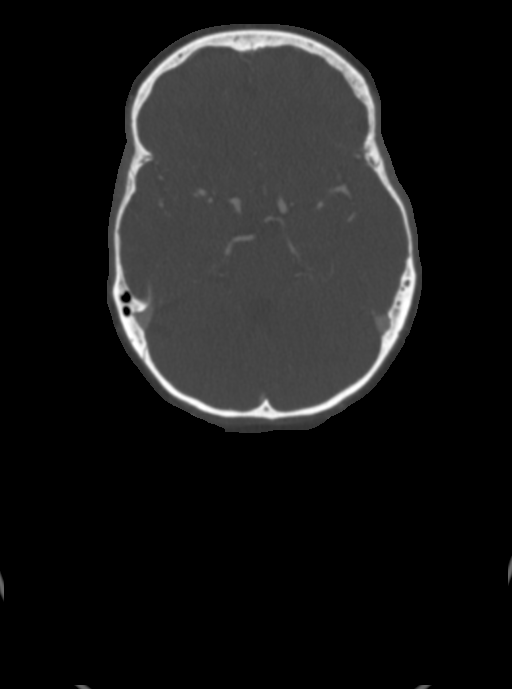
[im 537/645  soft-tissue]
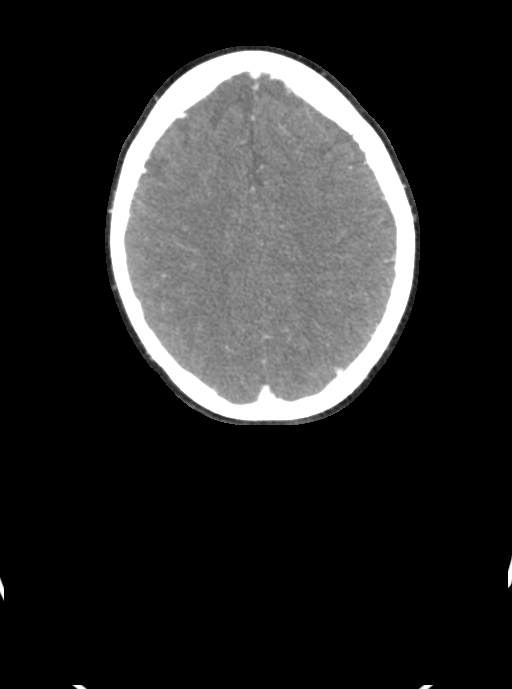

[Series 519: ax thin · axial · 0.42mm/px · z∈[-225,-118]mm · 2 of 323 slices shown]
[im 108/323  soft-tissue]
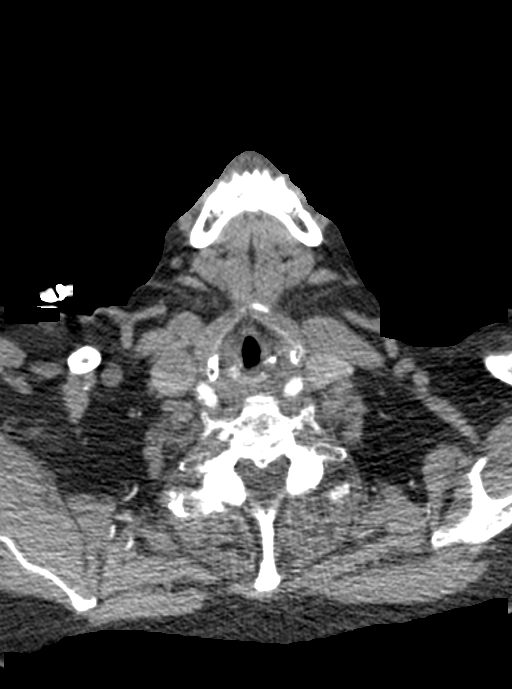
[im 215/323  soft-tissue]
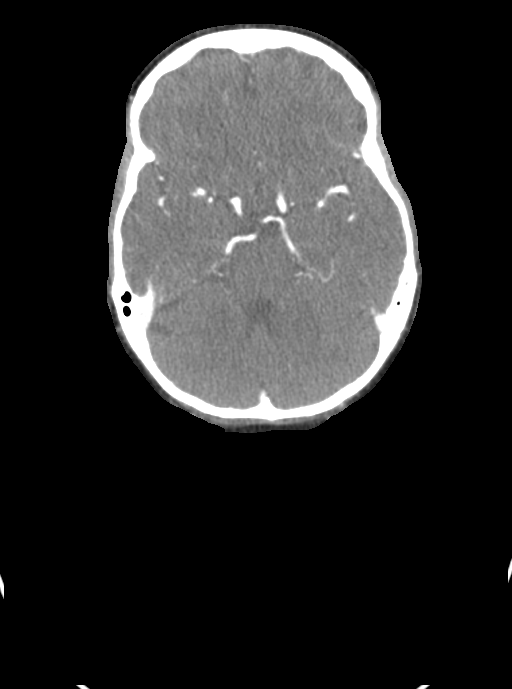

[7 of 33 positions shown; findings below may reference images not displayed]

FINDINGS: CT HEAD FINDINGS

Brain: There is an area of low attenuation within the left lentiform
nucleus. This may extend to the caudate. There is no acute
intracranial hemorrhage or mass effect. Ventricles and sulci are
normal in size and configuration. There is no extra-axial fluid
collection.

Vascular: No hyperdense vessel.

Skull: Unremarkable.

Sinuses: Aerated.

Orbits: Unremarkable.

Review of the MIP images confirms the above findings

CTA NECK FINDINGS

Aortic arch: Great vessel origins are patent. Plaque is present at
the origins, greatest along the proximal left subclavian with mild
stenosis.

Right carotid system: Patent. There is calcified and noncalcified
plaque at the bifurcation and ICA origin causing less than 50%
stenosis. Mild tortuosity of the distal cervical ICA.

Left carotid system: Patent. There is calcified and noncalcified
plaque at the ICA origin causing less than 50% stenosis. Mild
tortuosity of the distal cervical ICA.

Vertebral arteries: Extracranial vertebral arteries are patent.
Right vertebral artery is dominant.

Skeleton: Multilevel degenerative changes of the cervical spine.

Other neck: No mass or adenopathy. Small subcentimeter right thyroid
nodule, which does not require further follow-up.

Upper chest: No apical lung mass.

Review of the MIP images confirms the above findings

CTA HEAD FINDINGS

Anterior circulation: Intracranial internal carotid arteries are
patent. Anterior and middle cerebral arteries are patent.

Posterior circulation: Intracranial vertebral arteries, basilar
artery, and posterior cerebral arteries are patent. There is a large
right and diminutive left posterior communicating artery. Posterior
cerebral arteries are patent.

Venous sinuses: As permitted by contrast timing, patent.

Review of the MIP images confirms the above findings
IMPRESSION: Acute infarction involving the left lentiform nucleus possibly
extending to the caudate. No acute intracranial hemorrhage.

No large vessel occlusion. Plaque at the right greater than left ICA
origins with less than 50% stenosis.

## 2022-02-07 ENCOUNTER — Other Ambulatory Visit: Payer: Self-pay

## 2022-02-07 ENCOUNTER — Encounter: Payer: Self-pay | Admitting: Emergency Medicine

## 2022-02-07 ENCOUNTER — Encounter
Admission: EM | Disposition: A | Discharge status: Home / Self Care | Payer: Self-pay | Source: Home / Self Care | Attending: Surgery

## 2022-02-07 ENCOUNTER — Observation Stay: Payer: Medicare Other | Admitting: Anesthesiology

## 2022-02-07 ENCOUNTER — Emergency Department: Payer: Medicare Other

## 2022-02-07 ENCOUNTER — Inpatient Hospital Stay
Admission: EM | Admit: 2022-02-07 | Discharge: 2022-02-11 | DRG: 330 | Disposition: A | Discharge status: Home / Self Care | Payer: Medicare Other | Attending: Surgery | Admitting: Surgery

## 2022-02-07 DIAGNOSIS — K449 Diaphragmatic hernia without obstruction or gangrene: Secondary | ICD-10-CM | POA: Diagnosis present

## 2022-02-07 DIAGNOSIS — I700 Atherosclerosis of aorta: Secondary | ICD-10-CM | POA: Diagnosis present

## 2022-02-07 DIAGNOSIS — K353 Acute appendicitis with localized peritonitis, without perforation or gangrene: Secondary | ICD-10-CM | POA: Diagnosis not present

## 2022-02-07 DIAGNOSIS — K3580 Unspecified acute appendicitis: Secondary | ICD-10-CM | POA: Diagnosis not present

## 2022-02-07 DIAGNOSIS — Z6830 Body mass index (BMI) 30.0-30.9, adult: Secondary | ICD-10-CM

## 2022-02-07 DIAGNOSIS — Y838 Other surgical procedures as the cause of abnormal reaction of the patient, or of later complication, without mention of misadventure at the time of the procedure: Secondary | ICD-10-CM | POA: Diagnosis not present

## 2022-02-07 DIAGNOSIS — E785 Hyperlipidemia, unspecified: Secondary | ICD-10-CM | POA: Diagnosis present

## 2022-02-07 DIAGNOSIS — Z87891 Personal history of nicotine dependence: Secondary | ICD-10-CM

## 2022-02-07 DIAGNOSIS — Z79899 Other long term (current) drug therapy: Secondary | ICD-10-CM

## 2022-02-07 DIAGNOSIS — K5730 Diverticulosis of large intestine without perforation or abscess without bleeding: Secondary | ICD-10-CM | POA: Diagnosis present

## 2022-02-07 DIAGNOSIS — K8020 Calculus of gallbladder without cholecystitis without obstruction: Secondary | ICD-10-CM | POA: Diagnosis present

## 2022-02-07 DIAGNOSIS — E669 Obesity, unspecified: Secondary | ICD-10-CM | POA: Diagnosis present

## 2022-02-07 DIAGNOSIS — K9171 Accidental puncture and laceration of a digestive system organ or structure during a digestive system procedure: Secondary | ICD-10-CM | POA: Diagnosis not present

## 2022-02-07 HISTORY — PX: XI ROBOTIC LAPAROSCOPIC ASSISTED APPENDECTOMY: SHX6877

## 2022-02-07 LAB — URINALYSIS, ROUTINE W REFLEX MICROSCOPIC
Bilirubin Urine: NEGATIVE
Glucose, UA: NEGATIVE mg/dL
Hgb urine dipstick: NEGATIVE
Ketones, ur: NEGATIVE mg/dL
Leukocytes,Ua: NEGATIVE
Nitrite: NEGATIVE
Protein, ur: NEGATIVE mg/dL
Specific Gravity, Urine: 1.013 (ref 1.005–1.030)
pH: 7 (ref 5.0–8.0)

## 2022-02-07 LAB — COMPREHENSIVE METABOLIC PANEL
ALT: 21 U/L (ref 0–44)
AST: 16 U/L (ref 15–41)
Albumin: 4.4 g/dL (ref 3.5–5.0)
Alkaline Phosphatase: 84 U/L (ref 38–126)
Anion gap: 7 (ref 5–15)
BUN: 18 mg/dL (ref 8–23)
CO2: 26 mmol/L (ref 22–32)
Calcium: 9.4 mg/dL (ref 8.9–10.3)
Chloride: 105 mmol/L (ref 98–111)
Creatinine, Ser: 0.51 mg/dL (ref 0.44–1.00)
GFR, Estimated: >60 mL/min (ref >60)
Glucose, Bld: 105 mg/dL — ABNORMAL HIGH (ref 70–99)
Potassium: 4.3 mmol/L (ref 3.5–5.1)
Sodium: 138 mmol/L (ref 135–145)
Total Bilirubin: 1.4 mg/dL — ABNORMAL HIGH (ref 0.3–1.2)
Total Protein: 7.7 g/dL (ref 6.5–8.1)

## 2022-02-07 LAB — CBC
HCT: 39.4 % (ref 36.0–46.0)
Hemoglobin: 12.4 g/dL (ref 12.0–15.0)
MCH: 27.8 pg (ref 26.0–34.0)
MCHC: 31.5 g/dL (ref 30.0–36.0)
MCV: 88.3 fL (ref 80.0–100.0)
Platelets: 190 10*3/uL (ref 150–400)
RBC: 4.46 MIL/uL (ref 3.87–5.11)
RDW: 13.5 % (ref 11.5–15.5)
WBC: 7.2 10*3/uL (ref 4.0–10.5)
nRBC: 0.3 % — ABNORMAL HIGH (ref 0.0–0.2)

## 2022-02-07 LAB — LIPASE, BLOOD: Lipase: 32 U/L (ref 11–51)

## 2022-02-07 SURGERY — APPENDECTOMY, ROBOT-ASSISTED, LAPAROSCOPIC
Anesthesia: General

## 2022-02-07 MED ORDER — "VISTASEAL 4 ML SINGLE DOSE KIT "
PACK | CUTANEOUS | Status: DC | PRN
  Administered 2022-02-07: 4 mL via TOPICAL

## 2022-02-07 MED ORDER — SUCCINYLCHOLINE CHLORIDE 200 MG/10ML IV SOSY
PREFILLED_SYRINGE | INTRAVENOUS | Status: DC | PRN
  Administered 2022-02-07: 120 mg via INTRAVENOUS

## 2022-02-07 MED ORDER — DEXAMETHASONE SODIUM PHOSPHATE 10 MG/ML IJ SOLN
INTRAMUSCULAR | Status: DC | PRN
  Administered 2022-02-07: 10 mg via INTRAVENOUS

## 2022-02-07 MED ORDER — IOHEXOL 300 MG/ML  SOLN
100.0000 mL | Freq: Once | INTRAMUSCULAR | Status: AC | PRN
Start: 2022-02-07 — End: 2022-02-07
  Administered 2022-02-07: 100 mL via INTRAVENOUS

## 2022-02-07 MED ORDER — LACTATED RINGERS IV SOLN: INTRAVENOUS | Status: DC

## 2022-02-07 MED ORDER — PHENYLEPHRINE HCL-NACL 20-0.9 MG/250ML-% IV SOLN
INTRAVENOUS | Status: AC
  Filled 2022-02-07: qty 250

## 2022-02-07 MED ORDER — FENTANYL CITRATE (PF) 100 MCG/2ML IJ SOLN
INTRAMUSCULAR | Status: DC | PRN
Start: 2022-02-07 — End: 2022-02-07
  Administered 2022-02-07 (×2): 50 ug via INTRAVENOUS

## 2022-02-07 MED ORDER — FENTANYL CITRATE (PF) 100 MCG/2ML IJ SOLN
INTRAMUSCULAR | Status: AC
  Filled 2022-02-07: qty 2

## 2022-02-07 MED ORDER — PROPOFOL 10 MG/ML IV BOLUS
INTRAVENOUS | Status: DC | PRN
  Administered 2022-02-07: 150 mg via INTRAVENOUS

## 2022-02-07 MED ORDER — FENTANYL CITRATE (PF) 100 MCG/2ML IJ SOLN: 25.0000 ug | INTRAMUSCULAR | Status: DC | PRN

## 2022-02-07 MED ORDER — BUPIVACAINE HCL (PF) 0.5 % IJ SOLN
INTRAMUSCULAR | Status: AC
  Filled 2022-02-07: qty 30

## 2022-02-07 MED ORDER — ROCURONIUM BROMIDE 100 MG/10ML IV SOLN
INTRAVENOUS | Status: DC | PRN
  Administered 2022-02-07: 40 mg via INTRAVENOUS

## 2022-02-07 MED ORDER — OXYCODONE HCL 5 MG PO TABS: 5.0000 mg | ORAL_TABLET | Freq: Once | ORAL | Status: DC | PRN

## 2022-02-07 MED ORDER — ACETAMINOPHEN 10 MG/ML IV SOLN
INTRAVENOUS | Status: AC
  Filled 2022-02-07: qty 100

## 2022-02-07 MED ORDER — ONDANSETRON HCL 4 MG/2ML IJ SOLN
4.0000 mg | Freq: Once | INTRAMUSCULAR | Status: DC | PRN
Start: 2022-02-07 — End: 2022-02-08

## 2022-02-07 MED ORDER — LIDOCAINE-EPINEPHRINE (PF) 1 %-1:200000 IJ SOLN
INTRAMUSCULAR | Status: AC
  Filled 2022-02-07: qty 30

## 2022-02-07 MED ORDER — SODIUM CHLORIDE 0.9 % IV BOLUS
1000.0000 mL | Freq: Once | INTRAVENOUS | Status: AC
  Administered 2022-02-07: 1000 mL via INTRAVENOUS

## 2022-02-07 MED ORDER — ACETAMINOPHEN 10 MG/ML IV SOLN: 1000.0000 mg | Freq: Once | INTRAVENOUS | Status: DC | PRN

## 2022-02-07 MED ORDER — SUGAMMADEX SODIUM 200 MG/2ML IV SOLN
INTRAVENOUS | Status: DC | PRN
  Administered 2022-02-07: 200 mg via INTRAVENOUS

## 2022-02-07 MED ORDER — OXYCODONE HCL 5 MG/5ML PO SOLN: 5.0000 mg | Freq: Once | ORAL | Status: DC | PRN

## 2022-02-07 MED ORDER — ONDANSETRON HCL 4 MG/2ML IJ SOLN
INTRAMUSCULAR | Status: DC | PRN
  Administered 2022-02-07: 4 mg via INTRAVENOUS

## 2022-02-07 MED ORDER — SODIUM CHLORIDE 0.9 % IR SOLN
Status: DC | PRN
  Administered 2022-02-07: 100 mL

## 2022-02-07 MED ORDER — DEXMEDETOMIDINE (PRECEDEX) IN NS 20 MCG/5ML (4 MCG/ML) IV SYRINGE
PREFILLED_SYRINGE | INTRAVENOUS | Status: DC | PRN
  Administered 2022-02-07 (×4): 4 ug via INTRAVENOUS

## 2022-02-07 MED ORDER — LIDOCAINE-EPINEPHRINE (PF) 1 %-1:200000 IJ SOLN
INTRAMUSCULAR | Status: DC | PRN
  Administered 2022-02-07 (×2): 6 mL

## 2022-02-07 MED ORDER — BUPIVACAINE HCL (PF) 0.5 % IJ SOLN
INTRAMUSCULAR | Status: DC | PRN
  Administered 2022-02-07 (×2): 6 mL

## 2022-02-07 MED ORDER — LACTATED RINGERS IV SOLN: INTRAVENOUS | Status: DC | PRN

## 2022-02-07 MED ORDER — PIPERACILLIN-TAZOBACTAM 3.375 G IVPB 30 MIN
3.3750 g | Freq: Once | INTRAVENOUS | Status: AC
  Administered 2022-02-07: 3.375 g via INTRAVENOUS
  Filled 2022-02-07: qty 50

## 2022-02-07 MED ORDER — ACETAMINOPHEN 10 MG/ML IV SOLN
INTRAVENOUS | Status: DC | PRN
  Administered 2022-02-07: 1000 mg via INTRAVENOUS

## 2022-02-07 MED ORDER — PROPOFOL 10 MG/ML IV BOLUS
INTRAVENOUS | Status: AC
  Filled 2022-02-07: qty 20

## 2022-02-07 MED ORDER — HYDRALAZINE HCL 20 MG/ML IJ SOLN
INTRAMUSCULAR | Status: AC
  Filled 2022-02-07: qty 1

## 2022-02-07 SURGICAL SUPPLY — 78 items
ANCHOR TIS RET SYS 235ML (MISCELLANEOUS) ×2 IMPLANT
APPLICATOR VISTASEAL FLEXIBLE (MISCELLANEOUS) ×1 IMPLANT
BAG PRESSURE INF DISP 1000 (BAG) ×1 IMPLANT
BAG PRESSURE INF REUSE 1000 (BAG) IMPLANT
BASIN GRAD PLASTIC 32OZ STRL (MISCELLANEOUS) ×1 IMPLANT
BLADE SURG SZ11 CARB STEEL (BLADE) ×2 IMPLANT
BULB RESERV EVAC DRAIN JP 100C (MISCELLANEOUS) ×1 IMPLANT
CANNULA REDUC XI 12-8 STAPL (CANNULA) ×1
CANNULA REDUCER 12-8 DVNC XI (CANNULA) ×1 IMPLANT
COVER TIP SHEARS 8 DVNC (MISCELLANEOUS) ×1 IMPLANT
COVER TIP SHEARS 8MM DA VINCI (MISCELLANEOUS) ×1
DERMABOND ADVANCED (GAUZE/BANDAGES/DRESSINGS) ×1
DERMABOND ADVANCED .7 DNX12 (GAUZE/BANDAGES/DRESSINGS) ×1 IMPLANT
DRAIN CHANNEL JP 15F RND 16 (MISCELLANEOUS) IMPLANT
DRAPE ARM DVNC X/XI (DISPOSABLE) ×3 IMPLANT
DRAPE COLUMN DVNC XI (DISPOSABLE) ×1 IMPLANT
DRAPE DA VINCI XI ARM (DISPOSABLE) ×3
DRAPE DA VINCI XI COLUMN (DISPOSABLE) ×1
DRSG TEGADERM 4X4.75 (GAUZE/BANDAGES/DRESSINGS) ×1 IMPLANT
ELECT CAUTERY BLADE 6.4 (BLADE) IMPLANT
ELECT REM PT RETURN 9FT ADLT (ELECTROSURGICAL) ×2
ELECTRODE REM PT RTRN 9FT ADLT (ELECTROSURGICAL) ×1 IMPLANT
GAUZE SPONGE 4X4 12PLY STRL (GAUZE/BANDAGES/DRESSINGS) ×1 IMPLANT
GLOVE BIOGEL PI IND STRL 7.0 (GLOVE) ×2 IMPLANT
GLOVE BIOGEL PI INDICATOR 7.0 (GLOVE) ×4
GLOVE SURG SYN 6.5 ES PF (GLOVE) ×8 IMPLANT
GLOVE SURG SYN 6.5 PF PI (GLOVE) ×2 IMPLANT
GOWN STRL REUS W/ TWL LRG LVL3 (GOWN DISPOSABLE) ×3 IMPLANT
GOWN STRL REUS W/TWL LRG LVL3 (GOWN DISPOSABLE) ×4
GRASPER SUT TROCAR 14GX15 (MISCELLANEOUS) IMPLANT
IRRIGATOR SUCT 8 DISP DVNC XI (IRRIGATION / IRRIGATOR) IMPLANT
IRRIGATOR SUCTION 8MM XI DISP (IRRIGATION / IRRIGATOR) ×1
IV NS 1000ML (IV SOLUTION) ×1
IV NS 1000ML BAXH (IV SOLUTION) IMPLANT
JACKSON PRATT 7MM (INSTRUMENTS) IMPLANT
KIT TURNOVER KIT A (KITS) ×2 IMPLANT
LABEL OR SOLS (LABEL) IMPLANT
MANIFOLD NEPTUNE II (INSTRUMENTS) ×2 IMPLANT
NDL INSUFFLATION 14GA 120MM (NEEDLE) ×1 IMPLANT
NEEDLE HYPO 22GX1.5 SAFETY (NEEDLE) ×2 IMPLANT
NEEDLE INSUFFLATION 14GA 120MM (NEEDLE) ×2 IMPLANT
OBTURATOR OPTICAL STANDARD 8MM (TROCAR) ×1
OBTURATOR OPTICAL STND 8 DVNC (TROCAR) ×1
OBTURATOR OPTICALSTD 8 DVNC (TROCAR) ×1 IMPLANT
PACK LAP CHOLECYSTECTOMY (MISCELLANEOUS) ×2 IMPLANT
PENCIL ELECTRO HAND CTR (MISCELLANEOUS) ×2 IMPLANT
RELOAD STAPLE 45 2.5 WHT DVNC (STAPLE) IMPLANT
RELOAD STAPLE 45 3.5 BLU DVNC (STAPLE) ×1 IMPLANT
RELOAD STAPLER 2.5X45 WHT DVNC (STAPLE) IMPLANT
RELOAD STAPLER 3.5X45 BLU DVNC (STAPLE) ×1 IMPLANT
SEAL CANN UNIV 5-8 DVNC XI (MISCELLANEOUS) ×3 IMPLANT
SEAL XI 5MM-8MM UNIVERSAL (MISCELLANEOUS) ×3
SEALER VESSEL DA VINCI XI (MISCELLANEOUS) ×1
SEALER VESSEL EXT DVNC XI (MISCELLANEOUS) IMPLANT
SET TUBE SMOKE EVAC HIGH FLOW (TUBING) ×2 IMPLANT
SLEEVE SCD COMPRESS KNEE MED (STOCKING) ×1 IMPLANT
SOLUTION ELECTROLUBE (MISCELLANEOUS) ×2 IMPLANT
SPIKE FLUID TRANSFER (MISCELLANEOUS) ×2 IMPLANT
STAPLER 45 DA VINCI SURE FORM (STAPLE) ×1
STAPLER 45 SUREFORM DVNC (STAPLE) ×1 IMPLANT
STAPLER CANNULA SEAL DVNC XI (STAPLE) ×1 IMPLANT
STAPLER CANNULA SEAL XI (STAPLE) ×1
STAPLER RELOAD 2.5X45 WHITE (STAPLE)
STAPLER RELOAD 2.5X45 WHT DVNC (STAPLE)
STAPLER RELOAD 3.5X45 BLU DVNC (STAPLE) ×1
STAPLER RELOAD 3.5X45 BLUE (STAPLE) ×1
SUT ETHILON 3-0 FS-10 30 BLK (SUTURE) ×2
SUT MNCRL AB 4-0 PS2 18 (SUTURE) ×3 IMPLANT
SUT VIC AB 3-0 SH 27 (SUTURE) ×1
SUT VIC AB 3-0 SH 27X BRD (SUTURE) IMPLANT
SUT VICRYL 0 AB UR-6 (SUTURE) ×2 IMPLANT
SUT VLOC 90 6 CV-15 VIOLET (SUTURE) ×1 IMPLANT
SUTURE EHLN 3-0 FS-10 30 BLK (SUTURE) IMPLANT
SYR 30ML LL (SYRINGE) ×2 IMPLANT
SYSTEM WECK SHIELD CLOSURE (TROCAR) ×1 IMPLANT
TRAY FOLEY SLVR 16FR LF STAT (SET/KITS/TRAYS/PACK) ×1 IMPLANT
WATER STERILE IRR 500ML POUR (IV SOLUTION) ×2 IMPLANT
medline round drain IMPLANT

## 2022-02-07 NOTE — ED Provider Notes (Signed)
[facility] Provider Note  Patient Contact: 5:52 PM (approximate)   History   Abdominal Pain   HPI  Claudia Bryant is a 76 y.o. female who presents the emergency department with right lower quadrant abdominal pain.  Symptoms began yesterday.  Rather rapid onset.  Pain is sharp at this time.  Patient has no fevers, emesis, diarrhea, constipation, dysuria, polyuria, hematuria.  Still has all of her organs including reproductive organs and appendix.  No reported anorexia.  Pain worsened with movement or palpation.  No trauma to the area..     Physical Exam   Triage Vital Signs: ED Triage Vitals  Enc Vitals Group     BP 02/07/22 1408 (!) 157/73     Pulse Rate 02/07/22 1408 76     Resp 02/07/22 1408 20     Temp 02/07/22 1408 98.4 F (36.9 C)     Temp Source 02/07/22 1408 Oral     SpO2 02/07/22 1408 97 %     Weight 02/07/22 1333 160 lb 15 oz (73 kg)     Height 02/07/22 1333 5\' 1"  (1.549 m)     Head Circumference --      Peak Flow --      Pain Score 02/07/22 1332 7     Pain Loc --      Pain Edu? --      Excl. in GC? --     Most recent vital signs: Vitals:   02/07/22 1408 02/07/22 1714  BP: (!) 157/73 (!) 150/70  Pulse: 76 70  Resp: 20 18  Temp: 98.4 F (36.9 C)   SpO2: 97% 98%     General: Alert and in no acute distress.  Cardiovascular:  Good peripheral perfusion Respiratory: Normal respiratory effort without tachypnea or retractions. Lungs CTAB. Good air entry to the bases with no decreased or absent breath sounds Gastrointestinal: Bowel sounds 4 quadrants.  Soft to palpation.  Tender in the right lower quadrant to palpation with no other tenderness.. No guarding or rigidity. No palpable masses. No distention. No CVA tenderness. Musculoskeletal: Full range of motion to all extremities.  Neurologic:  No gross focal neurologic deficits are appreciated.  Skin:   No rash noted Other:   ED Results / Procedures / Treatments   Labs (all  labs ordered are listed, but only abnormal results are displayed) Labs Reviewed  COMPREHENSIVE METABOLIC PANEL - Abnormal; Notable for the following components:      Result Value   Glucose, Bld 105 (*)    Total Bilirubin 1.4 (*)    All other components within normal limits  CBC - Abnormal; Notable for the following components:   nRBC 0.3 (*)    All other components within normal limits  URINALYSIS, ROUTINE W REFLEX MICROSCOPIC - Abnormal; Notable for the following components:   Color, Urine YELLOW (*)    APPearance CLEAR (*)    All other components within normal limits  LIPASE, BLOOD     EKG     RADIOLOGY  I personally viewed, evaluated, and interpreted these images as part of my medical decision making, as well as reviewing the written report by the radiologist.  ED Provider Interpretation: Dilation of the appendix with periappendiceal stranding.  No evidence of obstruction.  Cholelithiasis without cholecystitis.  Diverticulosis without diverticulitis.  CT ABDOMEN PELVIS W CONTRAST  Result Date: 02/07/2022 CLINICAL DATA:  Right lower quadrant pain for 2 days EXAM: CT ABDOMEN AND PELVIS WITH CONTRAST TECHNIQUE: Multidetector CT imaging  of the abdomen and pelvis was performed using the standard protocol following bolus administration of intravenous contrast. RADIATION DOSE REDUCTION: This exam was performed according to the departmental dose-optimization program which includes automated exposure control, adjustment of the mA and/or kV according to patient size and/or use of iterative reconstruction technique. CONTRAST:  OMNIPAQUE IOHEXOL 300 MG/ML  SOLN COMPARISON:  None Available. FINDINGS: Lower chest: No acute abnormality.  Small hiatal hernia. Hepatobiliary: No solid liver abnormality is seen. Large rim calcified gallstone in the dependent gallbladder. No gallbladder wall thickening, or biliary dilatation. Pancreas: Unremarkable. No pancreatic ductal dilatation or surrounding  inflammatory changes. Spleen: Normal in size without significant abnormality. Adrenals/Urinary Tract: Adrenal glands are unremarkable. Kidneys are normal, without renal calculi, solid lesion, or hydronephrosis. Bladder is unremarkable. Stomach/Bowel: Stomach is within normal limits. Dilatation of the appendix measuring up to 1.0 cm in caliber with extensive adjacent fat stranding in the right lower quadrant (series 2, image 47). No evidence of bowel wall thickening, distention, or inflammatory changes. Sigmoid diverticula. Vascular/Lymphatic: Aortic atherosclerosis. No enlarged abdominal or pelvic lymph nodes. Reproductive: No mass or other significant abnormality. Other: No abdominal wall hernia or abnormality. No ascites. Musculoskeletal: No acute or significant osseous findings. IMPRESSION: 1. Dilatation of the appendix measuring up to 1.0 cm in caliber with extensive adjacent fat stranding in the right lower quadrant, consistent with acute appendicitis. No evidence of complicating perforation or abscess. 2. Cholelithiasis without evidence of acute cholecystitis. 3. Diverticulosis without evidence of acute diverticulitis. Aortic Atherosclerosis (ICD10-I70.0). Electronically Signed   By: Jearld Lesch M.D.   On: 02/07/2022 18:56    PROCEDURES:  Critical Care performed: No  Procedures   MEDICATIONS ORDERED IN ED: Medications  iohexol (OMNIPAQUE) 300 MG/ML solution 100 mL (100 mLs Intravenous Contrast Given 02/07/22 1822)  piperacillin-tazobactam (ZOSYN) IVPB 3.375 g (0 g Intravenous Stopped 02/07/22 1948)  sodium chloride 0.9 % bolus 1,000 mL (1,000 mLs Intravenous New Bag/Given 02/07/22 1909)     IMPRESSION / MDM / ASSESSMENT AND PLAN / ED COURSE  I reviewed the triage vital signs and the nursing notes.                              Differential diagnosis includes, but is not limited to, appendicitis, colitis, UTI, nephrolithiasis, cholecystitis  Patient's presentation is most consistent with  acute complicated illness / injury requiring diagnostic workup.   Patient's diagnosis is consistent with appendicitis.  Patient presented to the emergency department complaining of right lower quadrant abdominal pain.  Pain began rather rapidly yesterday.  Patient has had no associated fevers, chills, emesis, diarrhea, constipation.  No urinary changes.  Patient was overall well-appearing but was tender in the right lower quadrant.  Initial labs were reassuring with no elevation in the white blood cell count, LFTs were reassuring.  No evidence of UTI on urinalysis.  However given the ongoing right lower quadrant with tenderness on exam, patient had imaging to evaluate her pain.  Imaging returns with dilation of the appendix with periappendiceal stranding..  I discussed the patient with on-call surgeon, Dr. Tonna Boehringer.  At this time we will initiate Zosyn for the patient.  Dr. Tonna Boehringer will come to evaluate the patient in the ED. patient will be admitted to the surgical service for appendectomy tonight.      FINAL CLINICAL IMPRESSION(S) / ED DIAGNOSES   Final diagnoses:  Acute appendicitis with localized peritonitis, without perforation, abscess, or gangrene  Rx / DC Orders   ED Discharge Orders     None        Note:  This document was prepared using Dragon voice recognition software and may include unintentional dictation errors.   Lanette HampshireCuthriell, Caddie Randle D, PA-C 02/07/22 2132    Sharyn CreamerQuale, Mark, MD 02/07/22 2251

## 2022-02-07 NOTE — ED Notes (Signed)
Pa-c in with pt now.

## 2022-02-07 NOTE — Transfer of Care (Signed)
Immediate Anesthesia Transfer of Care Note  Patient: Claudia Bryant  Procedure(s) Performed: XI ROBOTIC LAPAROSCOPIC ASSISTED APPENDECTOMY  Patient Location: PACU  Anesthesia Type:General  Level of Consciousness: sedated and patient cooperative  Airway & Oxygen Therapy: Patient connected to face mask oxygen and Patient connected to face mask  Post-op Assessment: Report given to RN and Post -op Vital signs reviewed and stable  Post vital signs: Reviewed and stable  Last Vitals:  Vitals Value Taken Time  BP 159/73 02/07/22 2347  Temp 36.1 C 02/07/22 2342  Pulse 75 02/07/22 2348  Resp 15 02/07/22 2348  SpO2 100 % 02/07/22 2348  Vitals shown include unvalidated device data.  Last Pain:  Vitals:   02/07/22 2342  TempSrc:   PainSc: 0-No pain         Complications: No notable events documented.

## 2022-02-07 NOTE — ED Notes (Signed)
Pt to or.

## 2022-02-07 NOTE — H&P (Signed)
Subjective:   CC: acute appendicitis  HPI:  Claudia Bryant is a 76 y.o. female who is consulted by Cuthriell for evaluation of  above cc.  Symptoms were first noted 1 days ago. Pain is sharp, RLQ.  Associated with nothing, exacerbated by nothing     Past Medical History:  has a past medical history of Asthma and Hyperlipidemia.  Past Surgical History: tubal ligation  Family History: family history includes Heart attack in her father.  Social History:  reports that she has quit smoking. She has never used smokeless tobacco. She reports that she does not drink alcohol and does not use drugs.  Current Medications:  Prior to Admission medications   Medication Sig Start Date End Date Taking? Authorizing Provider  albuterol (VENTOLIN HFA) 108 (90 Base) MCG/ACT inhaler Inhale 1-2 puffs into the lungs every 4 (four) hours as needed for wheezing or shortness of breath. 07/31/19   [provider]  amLODipine (NORVASC) 5 MG tablet Take 1 tablet (5 mg total) by mouth daily. 11/05/19   Lurene Shadow, MD  atorvastatin (LIPITOR) 80 MG tablet Take 1 tablet (80 mg total) by mouth daily at 6 PM. 11/05/19   Lurene Shadow, MD    Allergies:  Allergies as of 02/07/2022   (No Known Allergies)    ROS:  General: Denies weight loss, weight gain, fatigue, fevers, chills, and night sweats. Eyes: Denies blurry vision, double vision, eye pain, itchy eyes, and tearing. Ears: Denies hearing loss, earache, and ringing in ears. Nose: Denies sinus pain, congestion, infections, runny nose, and nosebleeds. Mouth/throat: Denies hoarseness, sore throat, bleeding gums, and difficulty swallowing. Heart: Denies chest pain, palpitations, racing heart, irregular heartbeat, leg pain or swelling, and decreased activity tolerance. Respiratory: Denies breathing difficulty, shortness of breath, wheezing, cough, and sputum. GI: Denies change in appetite, heartburn, nausea, vomiting, constipation, diarrhea, and blood in  stool. GU: Denies difficulty urinating, pain with urinating, urgency, frequency, blood in urine. Musculoskeletal: Denies joint stiffness, pain, swelling, muscle weakness. Skin: Denies rash, itching, mass, tumors, sores, and boils Neurologic: Denies headache, fainting, dizziness, seizures, numbness, and tingling. Psychiatric: Denies depression, anxiety, difficulty sleeping, and memory loss. Endocrine: Denies heat or cold intolerance, and increased thirst or urination. Blood/lymph: Denies easy bruising, easy bruising, and swollen glands     Objective:     BP (!) 150/70 (BP Location: Left Arm)   Pulse 70   Temp 98.4 F (36.9 C) (Oral)   Resp 18   Ht 5\' 1"  (1.549 m)   Wt 73 kg   SpO2 98%   BMI 30.41 kg/m    Constitutional :  alert, cooperative, appears stated age, and no distress  Lymphatics/Throat:  no asymmetry, masses, or scars  Respiratory:  clear to auscultation bilaterally  Cardiovascular:  regular rate and rhythm  Gastrointestinal: Soft, no guarding, TTP in RLQ .   Musculoskeletal: Steady gait and movement  Skin: Cool and moist  Psychiatric: Normal affect, non-agitated, not confused       LABS:     Latest Ref Rng & Units 02/07/2022    1:34 PM 11/03/2019   11:36 AM  CMP  Glucose 70 - 99 mg/dL 01/03/2020  270   BUN 8 - 23 mg/dL 18  15   Creatinine 350 - 1.00 mg/dL 0.93  8.18   Sodium 2.99 - 145 mmol/L 138  139   Potassium 3.5 - 5.1 mmol/L 4.3  3.6   Chloride 98 - 111 mmol/L 105  106   CO2 22 - 32  mmol/L 26  22   Calcium 8.9 - 10.3 mg/dL 9.4  9.3   Total Protein 6.5 - 8.1 g/dL 7.7  7.7   Total Bilirubin 0.3 - 1.2 mg/dL 1.4  1.0   Alkaline Phos 38 - 126 U/L 84  81   AST 15 - 41 U/L 16  18   ALT 0 - 44 U/L 21  19       Latest Ref Rng & Units 02/07/2022    1:34 PM 11/03/2019   11:36 AM  CBC  WBC 4.0 - 10.5 K/uL 7.2  6.9   Hemoglobin 12.0 - 15.0 g/dL 74.0  81.4   Hematocrit 36.0 - 46.0 % 39.4  40.4   Platelets 150 - 400 K/uL 190  191      RADS: CLINICAL DATA:   Right lower quadrant pain for 2 days   EXAM: CT ABDOMEN AND PELVIS WITH CONTRAST   TECHNIQUE: Multidetector CT imaging of the abdomen and pelvis was performed using the standard protocol following bolus administration of intravenous contrast.   RADIATION DOSE REDUCTION: This exam was performed according to the departmental dose-optimization program which includes automated exposure control, adjustment of the mA and/or kV according to patient size and/or use of iterative reconstruction technique.   CONTRAST:  OMNIPAQUE IOHEXOL 300 MG/ML  SOLN   COMPARISON:  None Available.   FINDINGS: Lower chest: No acute abnormality.  Small hiatal hernia.   Hepatobiliary: No solid liver abnormality is seen. Large rim calcified gallstone in the dependent gallbladder. No gallbladder wall thickening, or biliary dilatation.   Pancreas: Unremarkable. No pancreatic ductal dilatation or surrounding inflammatory changes.   Spleen: Normal in size without significant abnormality.   Adrenals/Urinary Tract: Adrenal glands are unremarkable. Kidneys are normal, without renal calculi, solid lesion, or hydronephrosis. Bladder is unremarkable.   Stomach/Bowel: Stomach is within normal limits. Dilatation of the appendix measuring up to 1.0 cm in caliber with extensive adjacent fat stranding in the right lower quadrant (series 2, image 47). No evidence of bowel wall thickening, distention, or inflammatory changes. Sigmoid diverticula.   Vascular/Lymphatic: Aortic atherosclerosis. No enlarged abdominal or pelvic lymph nodes.   Reproductive: No mass or other significant abnormality.   Other: No abdominal wall hernia or abnormality. No ascites.   Musculoskeletal: No acute or significant osseous findings.   IMPRESSION: 1. Dilatation of the appendix measuring up to 1.0 cm in caliber with extensive adjacent fat stranding in the right lower quadrant, consistent with acute appendicitis. No evidence  of complicating perforation or abscess. 2. Cholelithiasis without evidence of acute cholecystitis. 3. Diverticulosis without evidence of acute diverticulitis.   Aortic Atherosclerosis (ICD10-I70.0).     Electronically Signed   By: Jearld Lesch M.D.   On: 02/07/2022 18:56 Assessment:      Acute appendicitis  Plan:      Discussed the risk of surgery including post-op infxn, seroma, hematoma, abscess formation, chronic pain, poor-delayed wound healing, possible bowel resection, possible ostomy, possible conversion to open procedure, post-op SBO or ileus, and need for additional procedures to address said risks.  The risks of general anesthetic including MI, CVA, sudden death or even reaction to anesthetic medications also discussed. Alternatives include continued observation, or antibiotic treatment.  Benefits include possible symptom relief,   Typical post operative recovery of 3-5 days rest, also discussed.  The patient understands the risks, any and all questions were answered to the patient's satisfaction.  To OR for robotic lap appy.   labs/images/medications/previous chart entries reviewed personally and  relevant changes/updates noted above.

## 2022-02-07 NOTE — Anesthesia Procedure Notes (Signed)
Procedure Name: Intubation Date/Time: 02/07/2022 9:12 PM  Performed by: Lendon Colonel, CRNAPre-anesthesia Checklist: Patient identified, Patient being monitored, Timeout performed, Emergency Drugs available and Suction available Patient Re-evaluated:Patient Re-evaluated prior to induction Oxygen Delivery Method: Circle system utilized Preoxygenation: Pre-oxygenation with 100% oxygen Induction Type: IV induction and Rapid sequence Laryngoscope Size: 3 and McGraph Grade View: Grade I Tube type: Oral Tube size: 7.0 mm Number of attempts: 1 Airway Equipment and Method: Stylet Placement Confirmation: ETT inserted through vocal cords under direct vision, positive ETCO2 and breath sounds checked- equal and bilateral Secured at: 21 cm Tube secured with: Tape Dental Injury: Teeth and Oropharynx as per pre-operative assessment

## 2022-02-07 NOTE — ED Triage Notes (Signed)
C/O RLQ pain x 2 days.  Denies N/V

## 2022-02-07 NOTE — Anesthesia Preprocedure Evaluation (Addendum)
Anesthesia Evaluation  Patient identified by MRN, date of birth, ID band Patient awake    Reviewed: Allergy & Precautions, NPO status , Patient's Chart, lab work & pertinent test results  History of Anesthesia Complications Negative for: history of anesthetic complications  Airway Mallampati: IV   Neck ROM: Full    Dental  (+) Implants   Pulmonary asthma , former smoker (quit 1990s),    Pulmonary exam normal breath sounds clear to auscultation       Cardiovascular hypertension, Normal cardiovascular exam Rhythm:Regular Rate:Normal  Echo 11/04/19:  1. Left ventricular ejection fraction, by estimation, is 60 to 65%. The left ventricle has normal function. The left ventricle has no regional wall motion abnormalities. There is mild left ventricular hypertrophy. Left ventricular diastolic parameters are consistent with Grade I diastolic dysfunction (impaired relaxation).  2. Right ventricular systolic function is normal. The right ventricular size is normal. Tricuspid regurgitation signal is inadequate for assessing PA pressure.  3. Left atrial size was mildly dilated.  4. The mitral valve is normal in structure. No evidence of mitral valve regurgitation. No evidence of mitral stenosis.  5. The aortic valve is normal in structure. Aortic valve regurgitation is not visualized. No aortic stenosis is present.  6. The inferior vena cava is normal in size with greater than 50% respiratory variability, suggesting right atrial pressure of 3 mmHg.  7. Agitated saline contrast bubble study was negative, with no evidence of any interatrial shunt.   Neuro/Psych CVA (10/2019), No Residual Symptoms    GI/Hepatic negative GI ROS,   Endo/Other  Obesity   Renal/GU negative Renal ROS     Musculoskeletal   Abdominal   Peds  Hematology negative hematology ROS (+)   Anesthesia Other Findings   Reproductive/Obstetrics                             Anesthesia Physical Anesthesia Plan  ASA: 3 and emergent  Anesthesia Plan: General   Post-op Pain Management:    Induction: Intravenous  PONV Risk Score and Plan: 3 and Ondansetron, Dexamethasone and Treatment may vary due to age or medical condition  Airway Management Planned: Oral ETT  Additional Equipment:   Intra-op Plan:   Post-operative Plan: Extubation in OR  Informed Consent: I have reviewed the patients History and Physical, chart, labs and discussed the procedure including the risks, benefits and alternatives for the proposed anesthesia with the patient or authorized representative who has indicated his/her understanding and acceptance.     Dental advisory given  Plan Discussed with: CRNA  Anesthesia Plan Comments: (Patient consented for risks of anesthesia including but not limited to:  - adverse reactions to medications - damage to eyes, teeth, lips or other oral mucosa - nerve damage due to positioning  - sore throat or hoarseness - damage to heart, brain, nerves, lungs, other parts of body or loss of life  Informed patient about role of CRNA in peri- and intra-operative care.  Patient voiced understanding.)        Anesthesia Quick Evaluation

## 2022-02-07 NOTE — ED Notes (Signed)
OR consent signed by pt.  Report given to Glade Nurse or nurse

## 2022-02-08 ENCOUNTER — Encounter: Payer: Self-pay | Admitting: Surgery

## 2022-02-08 DIAGNOSIS — K8020 Calculus of gallbladder without cholecystitis without obstruction: Secondary | ICD-10-CM | POA: Diagnosis present

## 2022-02-08 DIAGNOSIS — Z87891 Personal history of nicotine dependence: Secondary | ICD-10-CM | POA: Diagnosis not present

## 2022-02-08 DIAGNOSIS — K449 Diaphragmatic hernia without obstruction or gangrene: Secondary | ICD-10-CM | POA: Diagnosis present

## 2022-02-08 DIAGNOSIS — E785 Hyperlipidemia, unspecified: Secondary | ICD-10-CM | POA: Diagnosis present

## 2022-02-08 DIAGNOSIS — K9171 Accidental puncture and laceration of a digestive system organ or structure during a digestive system procedure: Secondary | ICD-10-CM | POA: Diagnosis not present

## 2022-02-08 DIAGNOSIS — Z79899 Other long term (current) drug therapy: Secondary | ICD-10-CM | POA: Diagnosis not present

## 2022-02-08 DIAGNOSIS — K353 Acute appendicitis with localized peritonitis, without perforation or gangrene: Secondary | ICD-10-CM | POA: Diagnosis present

## 2022-02-08 DIAGNOSIS — K3580 Unspecified acute appendicitis: Secondary | ICD-10-CM | POA: Diagnosis present

## 2022-02-08 DIAGNOSIS — K5730 Diverticulosis of large intestine without perforation or abscess without bleeding: Secondary | ICD-10-CM | POA: Diagnosis present

## 2022-02-08 DIAGNOSIS — I700 Atherosclerosis of aorta: Secondary | ICD-10-CM | POA: Diagnosis present

## 2022-02-08 DIAGNOSIS — Y838 Other surgical procedures as the cause of abnormal reaction of the patient, or of later complication, without mention of misadventure at the time of the procedure: Secondary | ICD-10-CM | POA: Diagnosis not present

## 2022-02-08 DIAGNOSIS — E669 Obesity, unspecified: Secondary | ICD-10-CM | POA: Diagnosis present

## 2022-02-08 DIAGNOSIS — Z6830 Body mass index (BMI) 30.0-30.9, adult: Secondary | ICD-10-CM | POA: Diagnosis not present

## 2022-02-08 LAB — BASIC METABOLIC PANEL
Anion gap: 7 (ref 5–15)
BUN: 13 mg/dL (ref 8–23)
CO2: 25 mmol/L (ref 22–32)
Calcium: 8.9 mg/dL (ref 8.9–10.3)
Chloride: 106 mmol/L (ref 98–111)
Creatinine, Ser: 0.53 mg/dL (ref 0.44–1.00)
GFR, Estimated: >60 mL/min (ref >60)
Glucose, Bld: 105 mg/dL — ABNORMAL HIGH (ref 70–99)
Potassium: 4 mmol/L (ref 3.5–5.1)
Sodium: 138 mmol/L (ref 135–145)

## 2022-02-08 LAB — CBC
HCT: 37.9 % (ref 36.0–46.0)
Hemoglobin: 12.3 g/dL (ref 12.0–15.0)
MCH: 28 pg (ref 26.0–34.0)
MCHC: 32.5 g/dL (ref 30.0–36.0)
MCV: 86.1 fL (ref 80.0–100.0)
Platelets: 196 10*3/uL (ref 150–400)
RBC: 4.4 MIL/uL (ref 3.87–5.11)
RDW: 13.4 % (ref 11.5–15.5)
WBC: 8.6 10*3/uL (ref 4.0–10.5)
nRBC: 0 % (ref 0.0–0.2)

## 2022-02-08 MED ORDER — ONDANSETRON 4 MG PO TBDP: 4.0000 mg | ORAL_TABLET | Freq: Four times a day (QID) | ORAL | Status: DC | PRN

## 2022-02-08 MED ORDER — ENOXAPARIN SODIUM 40 MG/0.4ML IJ SOSY
40.0000 mg | PREFILLED_SYRINGE | INTRAMUSCULAR | Status: DC
  Administered 2022-02-08 – 2022-02-10 (×3): 40 mg via SUBCUTANEOUS
  Filled 2022-02-08 (×3): qty 0.4

## 2022-02-08 MED ORDER — AMLODIPINE BESYLATE 5 MG PO TABS
5.0000 mg | ORAL_TABLET | Freq: Every day | ORAL | Status: DC
  Administered 2022-02-09 – 2022-02-11 (×3): 5 mg via ORAL
  Filled 2022-02-08 (×4): qty 1

## 2022-02-08 MED ORDER — ALBUTEROL SULFATE (2.5 MG/3ML) 0.083% IN NEBU: 2.5000 mg | INHALATION_SOLUTION | RESPIRATORY_TRACT | Status: DC | PRN

## 2022-02-08 MED ORDER — LABETALOL HCL 5 MG/ML IV SOLN
INTRAVENOUS | Status: DC | PRN
  Administered 2022-02-07: 2.5 mg via INTRAVENOUS

## 2022-02-08 MED ORDER — ONDANSETRON HCL 4 MG/2ML IJ SOLN: 4.0000 mg | Freq: Four times a day (QID) | INTRAMUSCULAR | Status: DC | PRN

## 2022-02-08 MED ORDER — ALBUTEROL SULFATE HFA 108 (90 BASE) MCG/ACT IN AERS: 1.0000 | INHALATION_SPRAY | RESPIRATORY_TRACT | Status: DC | PRN

## 2022-02-08 MED ORDER — HYDROCODONE-ACETAMINOPHEN 5-325 MG PO TABS
1.0000 | ORAL_TABLET | ORAL | Status: DC | PRN
  Administered 2022-02-08 – 2022-02-11 (×6): 1 via ORAL
  Filled 2022-02-08 (×7): qty 1

## 2022-02-08 MED ORDER — DOCUSATE SODIUM 100 MG PO CAPS: 100.0000 mg | ORAL_CAPSULE | Freq: Two times a day (BID) | ORAL | Status: DC | PRN

## 2022-02-08 MED ORDER — MORPHINE SULFATE (PF) 2 MG/ML IV SOLN: 2.0000 mg | INTRAVENOUS | Status: DC | PRN

## 2022-02-08 MED ORDER — ATORVASTATIN CALCIUM 80 MG PO TABS
80.0000 mg | ORAL_TABLET | Freq: Every day | ORAL | Status: DC
  Administered 2022-02-08 – 2022-02-10 (×3): 80 mg via ORAL
  Filled 2022-02-08 (×3): qty 1

## 2022-02-08 MED ORDER — TRAMADOL HCL 50 MG PO TABS: 50.0000 mg | ORAL_TABLET | Freq: Four times a day (QID) | ORAL | Status: DC | PRN

## 2022-02-08 MED ORDER — FENTANYL CITRATE (PF) 100 MCG/2ML IJ SOLN
INTRAMUSCULAR | Status: AC
  Administered 2022-02-08: 25 ug via INTRAVENOUS
  Filled 2022-02-08: qty 2

## 2022-02-08 NOTE — Op Note (Addendum)
Preoperative diagnosis: acute appendicitis  Postoperative diagnosis: Same  Procedure: Robotic assisted laparoscopic appendectomy.  Anesthesia: GETA  Surgeon: Sung Amabile  Wound Classification: clean contaminated  Specimen: Appendix  Complications: None  Estimated Blood Loss: 3 mL   Indications: Patient is a 76 y.o. female  presented with above.  Please see H&P for further details.    FIndings: 1.  Irritated appendix and mesoappendix inflammation and adhesions to cecum 2. 3. Normal anatomy 4. Appendiceal artery ligated, base transected with stapler 5. Adequate hemostasis.  6. Lembert repair of serosal injury  Description of procedure: The patient was placed on the operating table in the supine position, left arm tucked. General anesthesia was induced. A time-out was completed verifying correct patient, procedure, site, positioning, and implant(s) and/or special equipment prior to beginning this procedure. The abdomen was prepped and draped in the usual sterile fashion.   Palmer's point located and Veress needle was inserted.  After confirming 2 clicks and a positive saline drop test, gas insufflation was initiated until the abdominal pressure was measured at 15 mmHg.  Afterwards, the Veress needle was removed and a 8 mm port was placed through a periumbilical site using Optiview technique after incision with an 11 blade.  After local was infused, 2 additional incision was made 8 cm apart each side along the left side of the abdominal wall from the initial incision.  An 8 mm port was caudaed and 47mm port cephalad from initial incision, both under direct visualization.  No injuries from trocar placements were noted. The table was placed in the Trendelenburg position with the right side elevated.  Xi robotic platform was then brought to the operative field and docked.  An inflamed appendix was identified.  infection was present within the abdominal cavity due to appendicitis.  Mesoappendix adhered to cecal wall as appendix traveled in retrocecal fashion.  This was meticulously separated from surrounding tissue by creating Window created at base of appendix in the mesentery.   A blue load linear cutting stapler was then used to divide and staple the base of the appendix. The mesoappendix then had to be ligated with vessel sealer dur to inflammed nature until appendix was separated from surrounding tissue completely. The appendix was placed in an endoscopic retrieval bag and removed.   The appendiceal stump and mesoappendix staple line examined again and hemostasis noted. Possible serosal injury along dissection off cecum noted.  3-0 v-lock used to reinforce area in a lambert fashion, care to not involved the ileocecal valve. No other pathology was identified within pelvis. 15Fr round drain placed through LLQ port and placed around area, secured to skin using 3-0 nylon. The 12 mm trocar removed and port site closed with Efx shield using 0 vicryl under direct vision. Remaining trocars were removed under direct vision. No bleeding was noted.The abdomen was allowed to collapse. 3-0 vicryl interrupted used to close 13mm port site at dermal level, and then all skin incisions then closed with subcuticular sutures Monocryl 4-0.  Wounds then dressed with dermabond.  The patient tolerated the procedure well, awakened from anesthesia and was taken to the postanesthesia care unit in satisfactory condition.  Sponge count and instrument count correct at the end of the procedure.

## 2022-02-08 NOTE — Anesthesia Postprocedure Evaluation (Signed)
Anesthesia Post Note  Patient: Claudia Bryant  Procedure(s) Performed: XI ROBOTIC LAPAROSCOPIC ASSISTED APPENDECTOMY  Patient location during evaluation: PACU Anesthesia Type: General Level of consciousness: awake and alert, oriented and patient cooperative Pain management: pain level controlled Vital Signs Assessment: post-procedure vital signs reviewed and stable Respiratory status: spontaneous breathing, nonlabored ventilation and respiratory function stable Cardiovascular status: blood pressure returned to baseline and stable Postop Assessment: adequate PO intake Anesthetic complications: no   No notable events documented.   Last Vitals:  Vitals:   02/08/22 0058 02/08/22 0447  BP: (!) 143/64 (!) 143/63  Pulse: 77 84  Resp: 17 16  Temp: 37.2 C 37.4 C  SpO2: 95% 93%    Last Pain:  Vitals:   02/08/22 0447  TempSrc: Oral  PainSc: 0-No pain                 Reed Breech

## 2022-02-08 NOTE — Progress Notes (Signed)
Subjective:  CC: Claudia Bryant is a 76 y.o. female  Hospital stay day 0, 1 Day Post-Op robotic lap appy for appendicitis cecal wall repair  HPI: No acute issues overnight.  Pain controlled. No BM, or flatus  ROS:  General: Denies weight loss, weight gain, fatigue, fevers, chills, and night sweats. Heart: Denies chest pain, palpitations, racing heart, irregular heartbeat, leg pain or swelling, and decreased activity tolerance. Respiratory: Denies breathing difficulty, shortness of breath, wheezing, cough, and sputum. GI: Denies change in appetite, heartburn, nausea, vomiting, constipation, diarrhea, and blood in stool. GU: Denies difficulty urinating, pain with urinating, urgency, frequency, blood in urine.   Objective:   Temp:  [97 F (36.1 C)-99.4 F (37.4 C)] 99.4 F (37.4 C) (06/13 1950) Pulse Rate:  [72-88] 79 (06/13 1950) Resp:  [11-23] 18 (06/13 1950) BP: (123-179)/(61-78) 157/68 (06/13 1950) SpO2:  [93 %-100 %] 93 % (06/13 1950)     Height: 5\' 1"  (154.9 cm) Weight: 73 kg BMI (Calculated): 30.42   Intake/Output this shift:   Intake/Output Summary (Last 24 hours) at 02/08/2022 1950 Last data filed at 02/08/2022 1911 Gross per 24 hour  Intake 1080 ml  Output 690 ml  Net 390 ml    Constitutional :  alert, cooperative, appears stated age, and no distress  Respiratory:  clear to auscultation bilaterally  Cardiovascular:  regular rate and rhythm  Gastrointestinal: Soft, no guarding, TTP around incision, drain with serosanguinous fluid, slight tympany noted .   Skin: Cool and moist. Incisions c/d/i  Psychiatric: Normal affect, non-agitated, not confused       LABS:     Latest Ref Rng & Units 02/08/2022    4:58 AM 02/07/2022    1:34 PM 11/03/2019   11:36 AM  CMP  Glucose 70 - 99 mg/dL 01/03/2020  350  093   BUN 8 - 23 mg/dL 13  18  15    Creatinine 0.44 - 1.00 mg/dL 818   2.99   Sodium 135 - 145 mmol/L 138  138  139   Potassium 3.5 - 5.1 mmol/L 4.0  4.3  3.6   Chloride  98 - 111 mmol/L 106  105  106   CO2 22 - 32 mmol/L 25  26  22    Calcium 8.9 - 10.3 mg/dL 8.9  9.4  9.3   Total Protein 6.5 - 8.1 g/dL  7.7  7.7   Total Bilirubin 0.3 - 1.2 mg/dL  1.4  1.0   Alkaline Phos 38 - 126 U/L  84  81   AST 15 - 41 U/L  16  18   ALT 0 - 44 U/L  21  19       Latest Ref Rng & Units 02/08/2022    4:58 AM 02/07/2022    1:34 PM 11/03/2019   11:36 AM  CBC  WBC 4.0 - 10.5 K/uL 8.6  7.2  6.9   Hemoglobin 12.0 - 15.0 g/dL 02/10/2022  04/09/2022  01/03/2020   Hematocrit 36.0 - 46.0 % 37.9  39.4  40.4   Platelets 150 - 400 K/uL 196  190  191     RADS: N/a Assessment:   S/p robotic lap appy for appendicitis cecal wall repair  Will start clears since clinically doing well but monitor closely for ileus due to extent of inflammation and repair of cecal wall.  labs/images/medications/previous chart entries reviewed personally and relevant changes/updates noted above.

## 2022-02-09 LAB — BASIC METABOLIC PANEL
Anion gap: 5 (ref 5–15)
BUN: 13 mg/dL (ref 8–23)
CO2: 28 mmol/L (ref 22–32)
Calcium: 9 mg/dL (ref 8.9–10.3)
Chloride: 105 mmol/L (ref 98–111)
Creatinine, Ser: 0.5 mg/dL (ref 0.44–1.00)
GFR, Estimated: >60 mL/min (ref >60)
Glucose, Bld: 101 mg/dL — ABNORMAL HIGH (ref 70–99)
Potassium: 3.6 mmol/L (ref 3.5–5.1)
Sodium: 138 mmol/L (ref 135–145)

## 2022-02-09 LAB — CBC
HCT: 38.9 % (ref 36.0–46.0)
Hemoglobin: 12.4 g/dL (ref 12.0–15.0)
MCH: 28.2 pg (ref 26.0–34.0)
MCHC: 31.9 g/dL (ref 30.0–36.0)
MCV: 88.6 fL (ref 80.0–100.0)
Platelets: 180 10*3/uL (ref 150–400)
RBC: 4.39 MIL/uL (ref 3.87–5.11)
RDW: 13.7 % (ref 11.5–15.5)
WBC: 7 10*3/uL (ref 4.0–10.5)
nRBC: 0 % (ref 0.0–0.2)

## 2022-02-09 LAB — SURGICAL PATHOLOGY

## 2022-02-09 MED ORDER — SIMETHICONE 80 MG PO CHEW
80.0000 mg | CHEWABLE_TABLET | Freq: Four times a day (QID) | ORAL | Status: DC | PRN
Start: 2022-02-09 — End: 2022-02-11
  Administered 2022-02-09 – 2022-02-11 (×4): 80 mg via ORAL
  Filled 2022-02-09 (×4): qty 1

## 2022-02-09 NOTE — TOC Initial Note (Signed)
Transition of Care [facility]) - Initial/Assessment Note    Patient Details  Name: Claudia Bryant MRN: [id number] Date of Birth: 08-12-1946  Transition of [facility]) CM/SW Contact:    Chapman Fitch, RN Phone Number: 02/09/2022, 9:34 AM  Clinical Narrative:                  Transition of Care [facility]) Screening Note   Patient Details  Name: Claudia Bryant Date of Birth: 01-Jul-1946   Transition of Care [facility]) CM/SW Contact:    Chapman Fitch, RN Phone Number: 02/09/2022, 9:34 AM    Transition of Care Department [facility]) has reviewed patient and no TOC needs have been identified at this time. We will continue to monitor patient advancement through interdisciplinary progression rounds. If new patient transition needs arise, please place a TOC consult.          Patient Goals and CMS Choice        Expected Discharge Plan and Services                                                Prior Living Arrangements/Services                       Activities of Daily Living Home Assistive Devices/Equipment: None ADL Screening (condition at time of admission) Patient's cognitive ability adequate to safely complete daily activities?: Yes Is the patient deaf or have difficulty hearing?: No Does the patient have difficulty seeing, even when wearing glasses/contacts?: No Does the patient have difficulty concentrating, remembering, or making decisions?: No Patient able to express need for assistance with ADLs?: Yes Does the patient have difficulty dressing or bathing?: No Independently performs ADLs?: Yes (appropriate for developmental age) Does the patient have difficulty walking or climbing stairs?: No Weakness of Legs: None Weakness of Arms/Hands: None  Permission Sought/Granted                  Emotional Assessment              Admission diagnosis:  Acute appendicitis [K35.80] Acute appendicitis with localized peritonitis, without perforation,  abscess, or gangrene [K35.30] Patient Active Problem List   Diagnosis Date Noted   Acute appendicitis 02/07/2022   CVA (cerebral vascular accident) (HCC) 11/03/2019   Hyperlipidemia 11/03/2019   PCP:  Enid Baas, MD Pharmacy:   [facility] [address] [place] - [address] [place] [address] Phone: [phone] Fax: [phone]     Social Determinants of Health (SDOH) Interventions    Readmission Risk Interventions     No data to display

## 2022-02-09 NOTE — Progress Notes (Signed)
Subjective:  CC: Claudia Bryant is a 76 y.o. female  Hospital stay day 1, 2 Days Post-Op robotic lap appy for appendicitis cecal wall repair  HPI: No acute issues overnight.  Pain controlled. No BM, or flatus yet.  Tolerating clears but states some burping and bloating  ROS:  General: Denies weight loss, weight gain, fatigue, fevers, chills, and night sweats. Heart: Denies chest pain, palpitations, racing heart, irregular heartbeat, leg pain or swelling, and decreased activity tolerance. Respiratory: Denies breathing difficulty, shortness of breath, wheezing, cough, and sputum. GI: Denies change in appetite, heartburn, nausea, vomiting, constipation, diarrhea, and blood in stool. GU: Denies difficulty urinating, pain with urinating, urgency, frequency, blood in urine.   Objective:   Temp:  [98.5 F (36.9 C)-99.5 F (37.5 C)] 98.9 F (37.2 C) (06/14 1929) Pulse Rate:  [79-89] 84 (06/14 1929) Resp:  [18] 18 (06/14 1929) BP: (142-178)/(64-77) 167/72 (06/14 1929) SpO2:  [92 %-94 %] 94 % (06/14 1929)     Height: 5\' 1"  (154.9 cm) Weight: 73 kg BMI (Calculated): 30.42   Intake/Output this shift:   Intake/Output Summary (Last 24 hours) at 02/09/2022 1932 Last data filed at 02/09/2022 1851 Gross per 24 hour  Intake 1740 ml  Output 1150 ml  Net 590 ml    Constitutional :  alert, cooperative, appears stated age, and no distress  Respiratory:  clear to auscultation bilaterally  Cardiovascular:  regular rate and rhythm  Gastrointestinal: Soft, no guarding, TTP around incision, drain with serosanguinous fluid, tympany noted .   Skin: Cool and moist. Incisions c/d/i  Psychiatric: Normal affect, non-agitated, not confused       LABS:     Latest Ref Rng & Units 02/09/2022    4:25 AM 02/08/2022    4:58 AM 02/07/2022    1:34 PM  CMP  Glucose 70 - 99 mg/dL 04/09/2022  505  397   BUN 8 - 23 mg/dL 13  13  18    Creatinine 0.44 - 1.00 mg/dL 673   4.19   Sodium 135 - 145 mmol/L 138  138  138    Potassium 3.5 - 5.1 mmol/L 3.6  4.0  4.3   Chloride 98 - 111 mmol/L 105  106  105   CO2 22 - 32 mmol/L 28  25  26    Calcium 8.9 - 10.3 mg/dL 9.0  8.9  9.4   Total Protein 6.5 - 8.1 g/dL   7.7   Total Bilirubin 0.3 - 1.2 mg/dL   1.4   Alkaline Phos 38 - 126 U/L   84   AST 15 - 41 U/L   16   ALT 0 - 44 U/L   21       Latest Ref Rng & Units 02/09/2022    4:25 AM 02/08/2022    4:58 AM 02/07/2022    1:34 PM  CBC  WBC 4.0 - 10.5 K/uL 7.0  8.6  7.2   Hemoglobin 12.0 - 15.0 g/dL 02/11/2022  02/10/2022  04/09/2022   Hematocrit 36.0 - 46.0 % 38.9  37.9  39.4   Platelets 150 - 400 K/uL 180  196  190     RADS: N/a Assessment:   S/p robotic lap appy for appendicitis cecal wall repair  Tolerating clears for now but with some bloating, hiccups.  Will continue clears for now since she otherwise is feeling well.  labs/images/medications/previous chart entries reviewed personally and relevant changes/updates noted above.

## 2022-02-09 NOTE — Progress Notes (Signed)
Mobility Specialist - Progress Note    02/09/22 1500  Mobility  Activity Ambulated independently in hallway;Dangled on edge of bed;Stood at bedside  Level of Assistance Independent  Assistive Device None  Distance Ambulated (ft) 160 ft  Activity Response Tolerated well  $Mobility charge 1 Mobility    Pt supine upon arrival using RA. Completes bed mobility to sit EOB with MinA HHA for UE support but tolerates well. Completes STS and ambulation indep voicing only mild abdominal pain. Pt returns to bed with needs in reach, family at bedside.   Merrily Brittle Mobility Specialist 02/09/22, 3:29 PM

## 2022-02-10 LAB — CBC
HCT: 38 % (ref 36.0–46.0)
Hemoglobin: 12.2 g/dL (ref 12.0–15.0)
MCH: 28 pg (ref 26.0–34.0)
MCHC: 32.1 g/dL (ref 30.0–36.0)
MCV: 87.2 fL (ref 80.0–100.0)
Platelets: 197 10*3/uL (ref 150–400)
RBC: 4.36 MIL/uL (ref 3.87–5.11)
RDW: 13.4 % (ref 11.5–15.5)
WBC: 5.8 10*3/uL (ref 4.0–10.5)
nRBC: 0 % (ref 0.0–0.2)

## 2022-02-10 LAB — BASIC METABOLIC PANEL
Anion gap: 6 (ref 5–15)
BUN: 14 mg/dL (ref 8–23)
CO2: 27 mmol/L (ref 22–32)
Calcium: 8.9 mg/dL (ref 8.9–10.3)
Chloride: 105 mmol/L (ref 98–111)
Creatinine, Ser: 0.56 mg/dL (ref 0.44–1.00)
GFR, Estimated: >60 mL/min (ref >60)
Glucose, Bld: 129 mg/dL — ABNORMAL HIGH (ref 70–99)
Potassium: 3.4 mmol/L — ABNORMAL LOW (ref 3.5–5.1)
Sodium: 138 mmol/L (ref 135–145)

## 2022-02-10 MED ORDER — HYDROCODONE-ACETAMINOPHEN 5-325 MG PO TABS: 1.0000 | ORAL_TABLET | Freq: Four times a day (QID) | ORAL | 0 refills | Status: AC | PRN

## 2022-02-10 MED ORDER — DOCUSATE SODIUM 100 MG PO CAPS: 100.0000 mg | ORAL_CAPSULE | Freq: Two times a day (BID) | ORAL | 0 refills | Status: AC | PRN

## 2022-02-10 MED ORDER — ACETAMINOPHEN 325 MG PO TABS: 650.0000 mg | ORAL_TABLET | Freq: Three times a day (TID) | ORAL | 0 refills | Status: AC | PRN

## 2022-02-10 MED ORDER — IBUPROFEN 800 MG PO TABS: 800.0000 mg | ORAL_TABLET | Freq: Three times a day (TID) | ORAL | 0 refills | Status: AC | PRN

## 2022-02-10 NOTE — Progress Notes (Signed)
Mobility Specialist - Progress Note    02/10/22 1000  Mobility  Activity Ambulated independently in hallway;Stood at bedside;Dangled on edge of bed  Level of Assistance Independent  Assistive Device None  Distance Ambulated (ft) 100 ft  Activity Response Tolerated well  $Mobility charge 1 Mobility    Declines ambulating in hallway d/t "appearance" but agreeable to room activity. Ambulates indep voicing no complaints.  Clarisa Schools Mobility Specialist 02/10/22, 10:41 AM

## 2022-02-10 NOTE — Discharge Instructions (Signed)
Laparoscopic Appendectomy, Care After This sheet gives you information about how to care for yourself after your procedure. Your doctor may also give you more specific instructions. If you have problems or questions, contact your doctor. Follow these instructions at home: Care for cuts from surgery (incisions)  Follow instructions from your doctor about how to take care of your cuts from surgery. Make sure you: Wash your hands with soap and water before you change your bandage (dressing). If you cannot use soap and water, use hand sanitizer. Change your bandage as told by your doctor. Leave stitches (sutures), skin glue, or skin tape (adhesive) strips in place. They may need to stay in place for 2 weeks or longer. If tape strips get loose and curl up, you may trim the loose edges. Do not remove tape strips completely unless your doctor says it is okay. Do not take baths, swim, or use a hot tub until your doctor says it is okay. OK TO SHOWER 24HRS AFTER YOUR SURGERY.  Check your surgical cut area every day for signs of infection. Check for: More redness, swelling, or pain. More fluid or blood. Warmth. Pus or a bad smell. Activity Do not drive or use heavy machinery while taking prescription pain medicine. Do not play contact sports until your doctor says it is okay. Do not drive for 24 hours if you were given a medicine to help you relax (sedative). Rest as needed. Do not return to work or school until your doctor says it is okay. General instructions  tylenol and advil as needed for discomfort.  Please alternate between the two every four hours as needed for pain.    Use narcotics, if prescribed, only when tylenol and motrin is not enough to control pain.  325-650mg every 8hrs to max of 3000mg/24hrs (including the 325mg in every norco dose) for the tylenol.    Advil up to 800mg per dose every 8hrs as needed for pain.   To prevent or treat constipation while you are taking prescription pain  medicine, your doctor may recommend that you: Drink enough fluid to keep your pee (urine) clear or pale yellow. Take over-the-counter or prescription medicines. Eat foods that are high in fiber, such as fresh fruits and vegetables, whole grains, and beans. Limit foods that are high in fat and processed sugars, such as fried and sweet foods. Contact a doctor if: You develop a rash. You have more redness, swelling, or pain around your surgical cuts. You have more fluid or blood coming from your surgical cuts. Your surgical cuts feel warm to the touch. You have pus or a bad smell coming from your surgical cuts. You have a fever. One or more of your surgical cuts breaks open. Get help right away if: You have trouble breathing. You have chest pain. You faint or feel dizzy when you stand. You have leg pain. This information is not intended to replace advice given to you by your health care provider. Make sure you discuss any questions you have with your health care provider. Document Released: 05/24/2008 Document Revised: 03/05/2016 Document Reviewed: 02/01/2016 Elsevier Interactive Patient Education  2019 [employer]  

## 2022-02-10 NOTE — Plan of Care (Signed)
  Problem: Education: Goal: Knowledge of General Education information will improve Description: Including pain rating scale, medication(s)/side effects and non-pharmacologic comfort measures 02/10/2022 0912 by Ellis Parents, RN Outcome: Progressing 02/10/2022 0912 by Ellis Parents, RN Outcome: Not Progressing   Problem: Health Behavior/Discharge Planning: Goal: Ability to manage health-related needs will improve 02/10/2022 0912 by Ellis Parents, RN Outcome: Progressing 02/10/2022 0912 by Ellis Parents, RN Outcome: Not Progressing   Problem: Clinical Measurements: Goal: Ability to maintain clinical measurements within normal limits will improve 02/10/2022 0912 by Ellis Parents, RN Outcome: Progressing 02/10/2022 0912 by Ellis Parents, RN Outcome: Not Progressing Goal: Will remain free from infection 02/10/2022 0912 by Ellis Parents, RN Outcome: Progressing 02/10/2022 0912 by Ellis Parents, RN Outcome: Not Progressing Goal: Diagnostic test results will improve 02/10/2022 0912 by Ellis Parents, RN Outcome: Progressing 02/10/2022 0912 by Ellis Parents, RN Outcome: Not Progressing Goal: Respiratory complications will improve 02/10/2022 0912 by Ellis Parents, RN Outcome: Progressing 02/10/2022 0912 by Ellis Parents, RN Outcome: Not Progressing Goal: Cardiovascular complication will be avoided 02/10/2022 0912 by Ellis Parents, RN Outcome: Progressing 02/10/2022 0912 by Ellis Parents, RN Outcome: Not Progressing

## 2022-02-11 NOTE — Discharge Summary (Signed)
Physician Discharge Summary  Patient ID: Claudia Bryant MRN: [id number] DOB/AGE: [birth year] 76 y.o.  Admit date: 02/07/2022 Discharge date: 02/11/22  Admission Diagnoses: acute appendicitis  Discharge Diagnoses:  Same as above  Discharged Condition: good  Hospital Course: admitted for above. Underwent surgery.  Please see op note for details.  Post op, few days of no bowel function but eventually had return of bowel function, advanced diet with no issues. JP remained serosanguinous. At time of d/c, tolerating diet and pain controlled  Consults: None  Discharge Exam: Blood pressure (!) 153/63, pulse 71, temperature 98.6 F (37 C), resp. rate 18, height 5\' 1"  (1.549 m), weight 73 kg, SpO2 95 %. General appearance: alert, cooperative, and no distress GI: soft, non-tender; bowel sounds normal; no masses,  no organomegaly port site c/d/I.  JP with serosanguinous drainage  Disposition:  Discharge disposition: 01-Home or Self Care        Allergies as of 02/11/2022   No Known Allergies      Medication List     TAKE these medications    acetaminophen 325 MG tablet Commonly known as: Tylenol Take 2 tablets (650 mg total) by mouth every 8 (eight) hours as needed for mild pain.   albuterol 108 (90 Base) MCG/ACT inhaler Commonly known as: VENTOLIN HFA Inhale 1-2 puffs into the lungs every 4 (four) hours as needed for wheezing or shortness of breath.   amLODipine 5 MG tablet Commonly known as: NORVASC Take 1 tablet (5 mg total) by mouth daily.   atorvastatin 80 MG tablet Commonly known as: LIPITOR Take 1 tablet (80 mg total) by mouth daily at 6 PM.   docusate sodium 100 MG capsule Commonly known as: Colace Take 1 capsule (100 mg total) by mouth 2 (two) times daily as needed for up to 10 days for mild constipation.   HYDROcodone-acetaminophen 5-325 MG tablet Commonly known as: Norco Take 1 tablet by mouth every 6 (six) hours as needed for up to 6 doses for moderate  pain.   ibuprofen 800 MG tablet Commonly known as: ADVIL Take 1 tablet (800 mg total) by mouth every 8 (eight) hours as needed for mild pain or moderate pain.        Follow-up Information     [place], Raydel Hosick, DO. Go on 02/15/2022.   Specialty: Surgery Why: Appointment @ 8:15 am. Please bring insurance card and list of medications. Contact information: [address] [place] [phone]                  Total time spent arranging discharge was >38min. Signed: 31m 02/11/2022, 2:52 PM

## 2022-02-11 NOTE — Progress Notes (Signed)
Patient medically cleared with discharge order placed by MD.  AVS provided along with instructions and supplies regarding care for JP drain with patient verbalizing understanding of education.  Patient also received teachings regarding changes to medications and follow up appointments.  PIV removed prior to d/c with patient transported off the unit by volunteer with son providing transport to home.

## 2022-02-11 NOTE — Progress Notes (Signed)
Mobility Specialist - Progress Note    02/11/22 0900  Mobility  Activity Ambulated independently in hallway  Level of Assistance Independent  Assistive Device None  Distance Ambulated (ft) 160 ft  Activity Response Tolerated well  $Mobility charge 1 Mobility    Clarisa Schools Mobility Specialist 02/11/22, 9:49 AM

## 2023-01-07 IMAGING — CT CT ABD-PELV W/ CM
2 of 5 series · 16 of 46 positions shown, 18 images · IV contrast (APPLIED)
Comparison: None Available.

CLINICAL DATA: Right lower quadrant pain for 2 days

EXAM:
CT ABDOMEN AND PELVIS WITH CONTRAST
TECHNIQUE: Multidetector CT imaging of the abdomen and pelvis was performed
using the standard protocol following bolus administration of
intravenous contrast.

[Series 2: abdomen 5.0 · axial · 0.70mm/px · z∈[-1040,-680]mm · 13 of 86 slices shown, 15 images]
[im 7/86  soft-tissue]
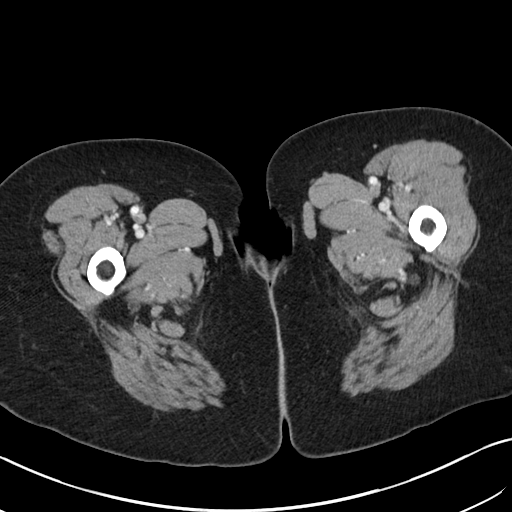
[im 7/86  bone]
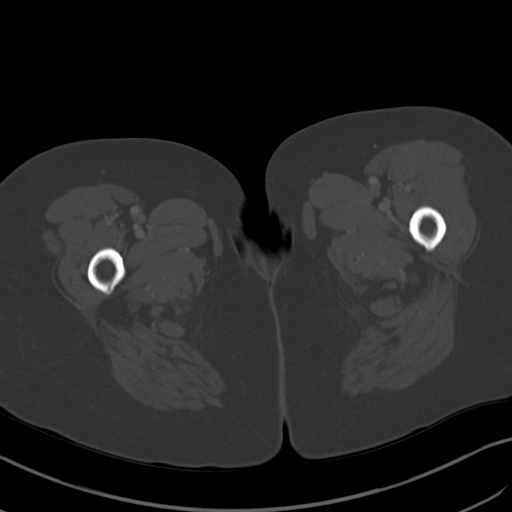
[im 13/86  soft-tissue]
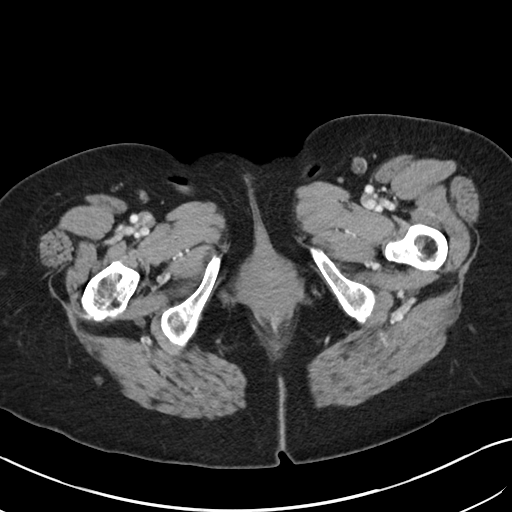
[im 19/86  soft-tissue]
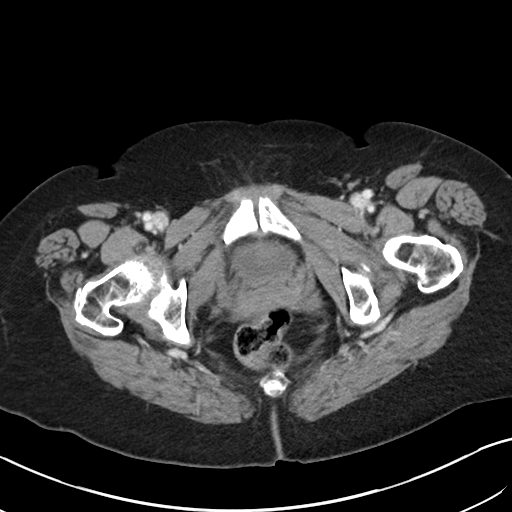
[im 25/86  soft-tissue]
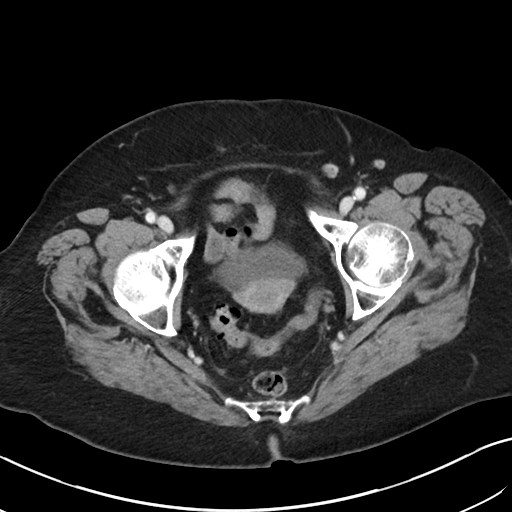
[im 31/86  soft-tissue]
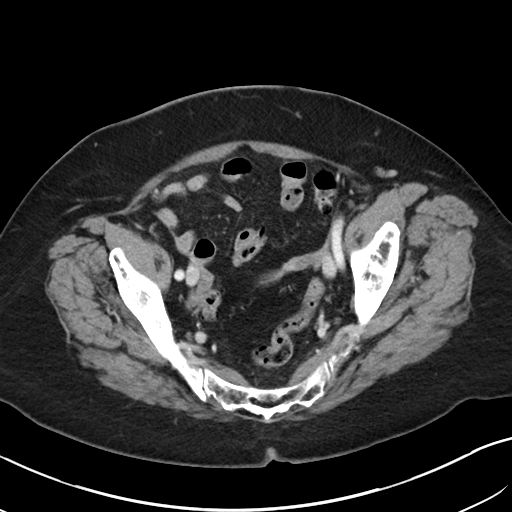
[im 37/86  soft-tissue]
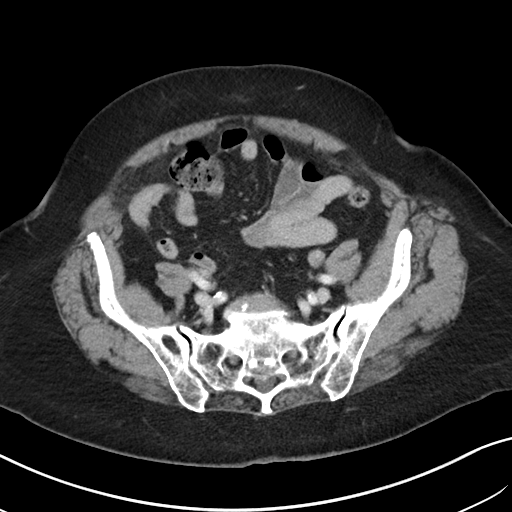
[im 43/86  soft-tissue]
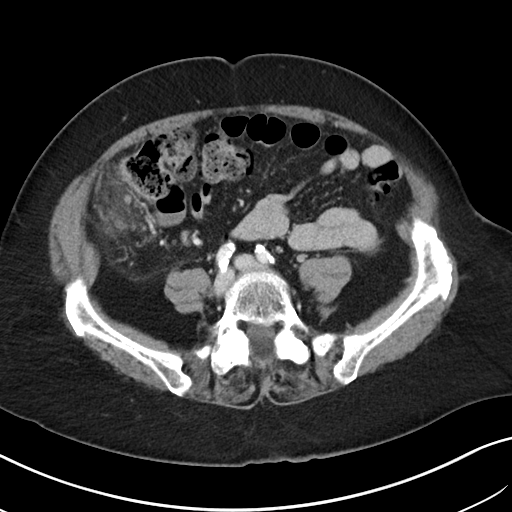
[im 49/86  soft-tissue]
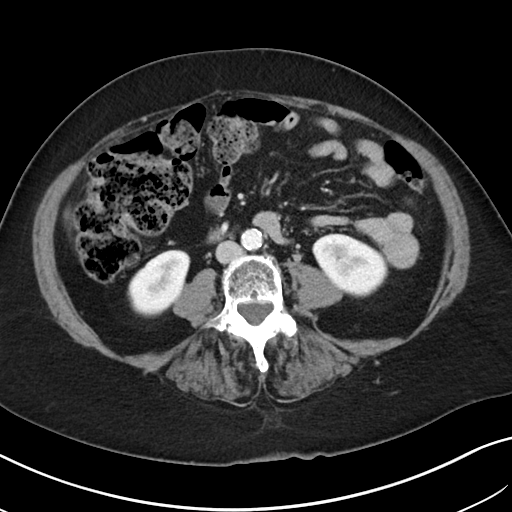
[im 55/86  soft-tissue]
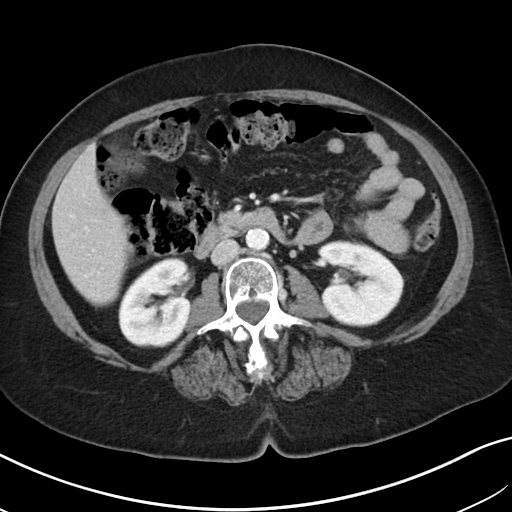
[im 55/86  bone]
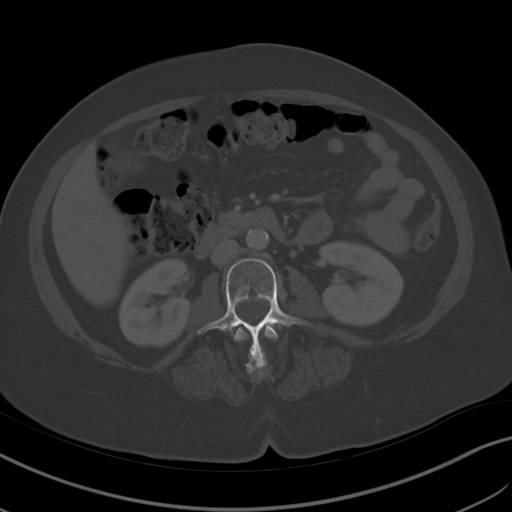
[im 61/86  soft-tissue]
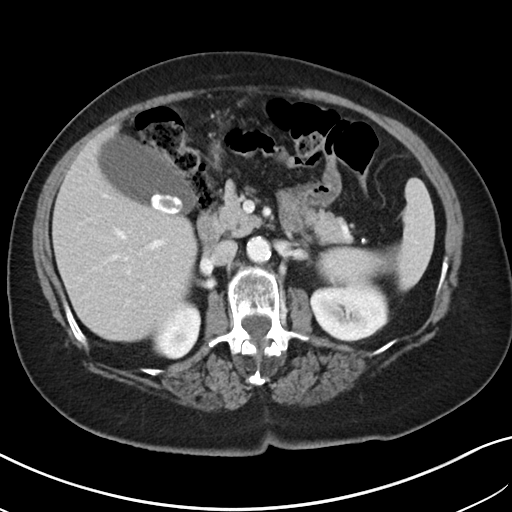
[im 67/86  soft-tissue]
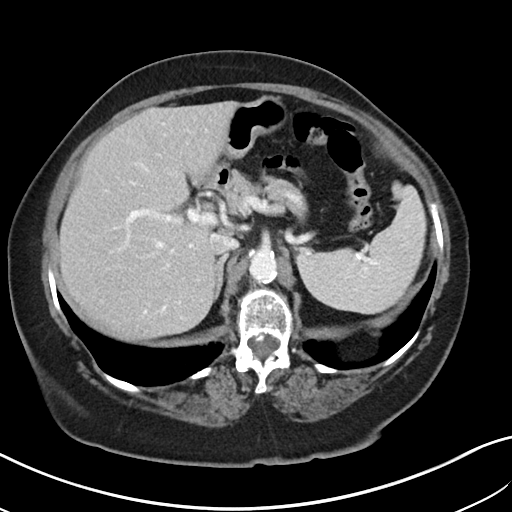
[im 73/86  soft-tissue]
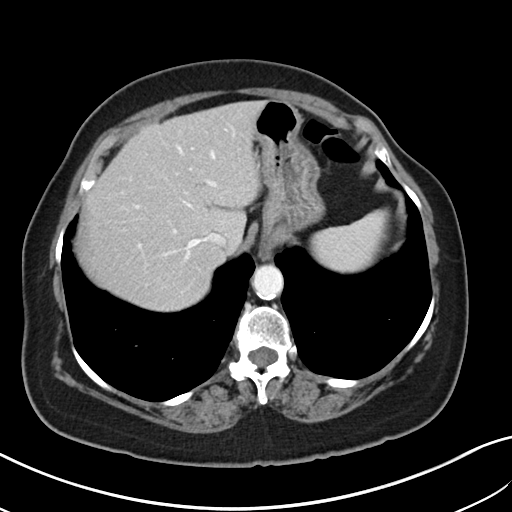
[im 79/86  soft-tissue]
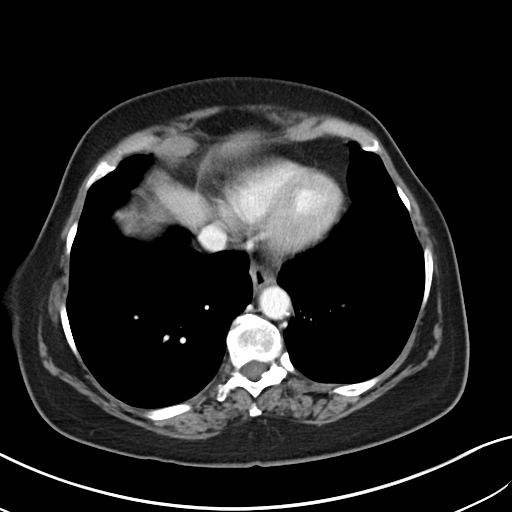

[Series 5: abdomen 3.0 mpr cor · coronal · 0.87mm/px · 3 of 102 slices shown]
[im 34/102  soft-tissue]
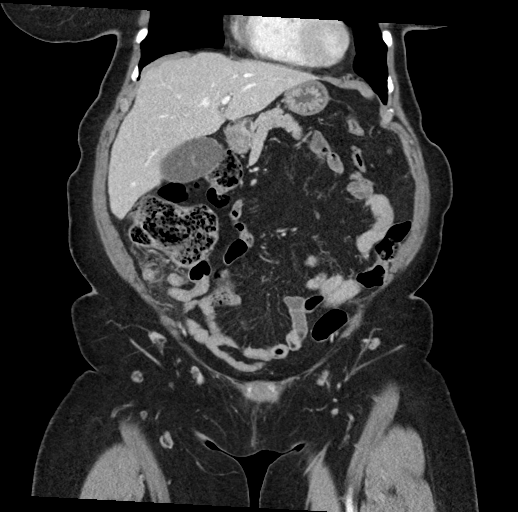
[im 45/102  soft-tissue]
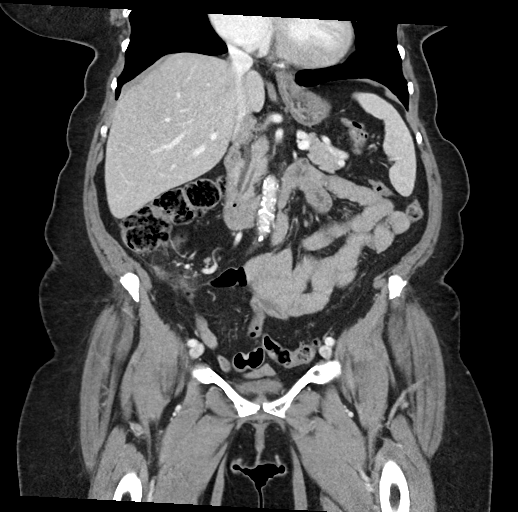
[im 57/102  soft-tissue]
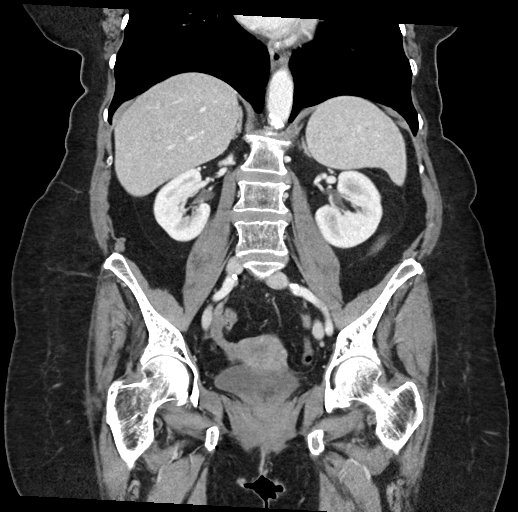

[16 of 46 positions shown; findings below may reference images not displayed]

RADIATION DOSE REDUCTION: This exam was performed according to the
departmental dose-optimization program which includes automated
exposure control, adjustment of the mA and/or kV according to
patient size and/or use of iterative reconstruction technique.

CONTRAST:  100mL OMNIPAQUE IOHEXOL 300 MG/ML  SOLN
FINDINGS: Lower chest: No acute abnormality.  Small hiatal hernia.

Hepatobiliary: No solid liver abnormality is seen. Large rim
calcified gallstone in the dependent gallbladder. No gallbladder
wall thickening, or biliary dilatation.

Pancreas: Unremarkable. No pancreatic ductal dilatation or
surrounding inflammatory changes.

Spleen: Normal in size without significant abnormality.

Adrenals/Urinary Tract: Adrenal glands are unremarkable. Kidneys are
normal, without renal calculi, solid lesion, or hydronephrosis.
Bladder is unremarkable.

Stomach/Bowel: Stomach is within normal limits. Dilatation of the
appendix measuring up to 1.0 cm in caliber with extensive adjacent
fat stranding in the right lower quadrant (series 2, image 47). No
evidence of bowel wall thickening, distention, or inflammatory
changes. Sigmoid diverticula.

Vascular/Lymphatic: Aortic atherosclerosis. No enlarged abdominal or
pelvic lymph nodes.

Reproductive: No mass or other significant abnormality.

Other: No abdominal wall hernia or abnormality. No ascites.

Musculoskeletal: No acute or significant osseous findings.
IMPRESSION: 1. Dilatation of the appendix measuring up to 1.0 cm in caliber with
extensive adjacent fat stranding in the right lower quadrant,
consistent with acute appendicitis. No evidence of complicating
perforation or abscess.
2. Cholelithiasis without evidence of acute cholecystitis.
3. Diverticulosis without evidence of acute diverticulitis.

Aortic Atherosclerosis ([id number]).
